# Patient Record
Sex: Female | Born: 1960 | Race: White | Hispanic: No | Marital: Married | Smoking: Never smoker
Health system: Southern US, Community
[De-identification: ages and names within clinical notes are randomized; demographics above are authoritative.]

## PROBLEM LIST (undated history)

## (undated) DIAGNOSIS — F419 Anxiety disorder, unspecified: Secondary | ICD-10-CM

## (undated) DIAGNOSIS — T7840XA Allergy, unspecified, initial encounter: Secondary | ICD-10-CM

## (undated) DIAGNOSIS — F32A Depression, unspecified: Secondary | ICD-10-CM

## (undated) DIAGNOSIS — I10 Essential (primary) hypertension: Secondary | ICD-10-CM

## (undated) DIAGNOSIS — R5382 Chronic fatigue, unspecified: Secondary | ICD-10-CM

## (undated) DIAGNOSIS — G43909 Migraine, unspecified, not intractable, without status migrainosus: Secondary | ICD-10-CM

## (undated) DIAGNOSIS — F329 Major depressive disorder, single episode, unspecified: Secondary | ICD-10-CM

## (undated) DIAGNOSIS — D4989 Neoplasm of unspecified behavior of other specified sites: Secondary | ICD-10-CM

## (undated) DIAGNOSIS — E785 Hyperlipidemia, unspecified: Secondary | ICD-10-CM

## (undated) DIAGNOSIS — K8021 Calculus of gallbladder without cholecystitis with obstruction: Secondary | ICD-10-CM

## (undated) DIAGNOSIS — G8929 Other chronic pain: Secondary | ICD-10-CM

## (undated) HISTORY — DX: Anxiety disorder, unspecified: F41.9

## (undated) HISTORY — PX: TONSILLECTOMY: SUR1361

## (undated) HISTORY — DX: Migraine, unspecified, not intractable, without status migrainosus: G43.909

## (undated) HISTORY — DX: Other chronic pain: G89.29

## (undated) HISTORY — PX: PAROTIDECTOMY: SUR1003

## (undated) HISTORY — DX: Chronic fatigue, unspecified: R53.82

## (undated) HISTORY — DX: Neoplasm of unspecified behavior of other specified sites: D49.89

## (undated) HISTORY — PX: CHOLECYSTECTOMY: SHX55

## (undated) HISTORY — DX: Allergy, unspecified, initial encounter: T78.40XA

## (undated) HISTORY — DX: Calculus of gallbladder without cholecystitis with obstruction: K80.21

---
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
## 1985-08-02 DIAGNOSIS — K8021 Calculus of gallbladder without cholecystitis with obstruction: Secondary | ICD-10-CM

## 1985-08-02 HISTORY — DX: Calculus of gallbladder without cholecystitis with obstruction: K80.21

## 2015-05-06 DIAGNOSIS — F1190 Opioid use, unspecified, uncomplicated: Secondary | ICD-10-CM | POA: Insufficient documentation

## 2015-05-06 DIAGNOSIS — E669 Obesity, unspecified: Secondary | ICD-10-CM | POA: Insufficient documentation

## 2015-05-06 DIAGNOSIS — G44221 Chronic tension-type headache, intractable: Secondary | ICD-10-CM | POA: Insufficient documentation

## 2015-05-06 DIAGNOSIS — F339 Major depressive disorder, recurrent, unspecified: Secondary | ICD-10-CM | POA: Insufficient documentation

## 2015-05-06 DIAGNOSIS — I10 Essential (primary) hypertension: Secondary | ICD-10-CM | POA: Insufficient documentation

## 2015-05-06 DIAGNOSIS — E785 Hyperlipidemia, unspecified: Secondary | ICD-10-CM | POA: Insufficient documentation

## 2016-01-21 DIAGNOSIS — G894 Chronic pain syndrome: Secondary | ICD-10-CM | POA: Insufficient documentation

## 2016-01-21 DIAGNOSIS — F331 Major depressive disorder, recurrent, moderate: Secondary | ICD-10-CM | POA: Insufficient documentation

## 2016-04-29 ENCOUNTER — Other Ambulatory Visit: Payer: Self-pay | Admitting: Family Medicine

## 2016-04-29 DIAGNOSIS — R519 Headache, unspecified: Secondary | ICD-10-CM

## 2016-04-29 DIAGNOSIS — R51 Headache: Principal | ICD-10-CM

## 2016-05-13 ENCOUNTER — Ambulatory Visit (HOSPITAL_BASED_OUTPATIENT_CLINIC_OR_DEPARTMENT_OTHER): Payer: Commercial Managed Care - HMO

## 2016-07-24 ENCOUNTER — Encounter (HOSPITAL_COMMUNITY): Payer: Self-pay

## 2016-07-24 ENCOUNTER — Emergency Department (HOSPITAL_COMMUNITY)
Admission: EM | Admit: 2016-07-24 | Discharge: 2016-07-24 | Disposition: A | Discharge status: Home / Self Care | Payer: Managed Care, Other (non HMO) | Attending: Emergency Medicine | Admitting: Emergency Medicine

## 2016-07-24 DIAGNOSIS — Y9389 Activity, other specified: Secondary | ICD-10-CM | POA: Diagnosis not present

## 2016-07-24 DIAGNOSIS — Y92512 Supermarket, store or market as the place of occurrence of the external cause: Secondary | ICD-10-CM | POA: Insufficient documentation

## 2016-07-24 DIAGNOSIS — Y999 Unspecified external cause status: Secondary | ICD-10-CM | POA: Insufficient documentation

## 2016-07-24 DIAGNOSIS — I10 Essential (primary) hypertension: Secondary | ICD-10-CM | POA: Diagnosis not present

## 2016-07-24 DIAGNOSIS — S0181XA Laceration without foreign body of other part of head, initial encounter: Secondary | ICD-10-CM

## 2016-07-24 DIAGNOSIS — S01112A Laceration without foreign body of left eyelid and periocular area, initial encounter: Secondary | ICD-10-CM | POA: Diagnosis not present

## 2016-07-24 DIAGNOSIS — R55 Syncope and collapse: Secondary | ICD-10-CM | POA: Insufficient documentation

## 2016-07-24 DIAGNOSIS — W228XXA Striking against or struck by other objects, initial encounter: Secondary | ICD-10-CM | POA: Diagnosis not present

## 2016-07-24 DIAGNOSIS — R600 Localized edema: Secondary | ICD-10-CM | POA: Insufficient documentation

## 2016-07-24 DIAGNOSIS — S0993XA Unspecified injury of face, initial encounter: Secondary | ICD-10-CM | POA: Diagnosis present

## 2016-07-24 HISTORY — DX: Depression, unspecified: F32.A

## 2016-07-24 HISTORY — DX: Hyperlipidemia, unspecified: E78.5

## 2016-07-24 HISTORY — DX: Essential (primary) hypertension: I10

## 2016-07-24 HISTORY — DX: Major depressive disorder, single episode, unspecified: F32.9

## 2016-07-24 LAB — BASIC METABOLIC PANEL
Anion gap: 10 (ref 5–15)
BUN: 20 mg/dL (ref 6–20)
CALCIUM: 9.3 mg/dL (ref 8.9–10.3)
CO2: 23 mmol/L (ref 22–32)
CREATININE: 1.07 mg/dL — AB (ref 0.44–1.00)
Chloride: 103 mmol/L (ref 101–111)
GFR calc Af Amer: >60 mL/min (ref >60)
GFR calc non Af Amer: 58 mL/min — ABNORMAL LOW (ref >60)
GLUCOSE: 144 mg/dL — AB (ref 65–99)
Potassium: 4.9 mmol/L (ref 3.5–5.1)
Sodium: 136 mmol/L (ref 135–145)

## 2016-07-24 LAB — CBC
HEMATOCRIT: 37.9 % (ref 36.0–46.0)
Hemoglobin: 12.5 g/dL (ref 12.0–15.0)
MCH: 28.2 pg (ref 26.0–34.0)
MCHC: 33 g/dL (ref 30.0–36.0)
MCV: 85.6 fL (ref 78.0–100.0)
Platelets: 372 10*3/uL (ref 150–400)
RBC: 4.43 MIL/uL (ref 3.87–5.11)
RDW: 14.1 % (ref 11.5–15.5)
WBC: 14.6 10*3/uL — ABNORMAL HIGH (ref 4.0–10.5)

## 2016-07-24 LAB — TROPONIN I: Troponin I: <0.03 ng/mL (ref <0.03)

## 2016-07-24 MED ORDER — DIAZEPAM 5 MG PO TABS
5.0000 mg | ORAL_TABLET | Freq: Once | ORAL | Status: AC
  Administered 2016-07-24: 5 mg via ORAL
  Filled 2016-07-24: qty 1

## 2016-07-24 MED ORDER — KETOROLAC TROMETHAMINE 15 MG/ML IJ SOLN
15.0000 mg | Freq: Once | INTRAMUSCULAR | Status: AC
  Administered 2016-07-24: 15 mg via INTRAVENOUS
  Filled 2016-07-24: qty 1

## 2016-07-24 MED ORDER — SODIUM CHLORIDE 0.9 % IV BOLUS (SEPSIS)
1000.0000 mL | Freq: Once | INTRAVENOUS | Status: AC
  Administered 2016-07-24: 1000 mL via INTRAVENOUS

## 2016-07-24 MED ORDER — CYCLOBENZAPRINE HCL 5 MG PO TABS: 5.0000 mg | ORAL_TABLET | Freq: Three times a day (TID) | ORAL | 0 refills | Status: DC | PRN

## 2016-07-24 NOTE — ED Provider Notes (Signed)
[facility] DEPT Provider Note   CSN: [id number] Arrival date & time: 07/24/16  1637     History   Chief Complaint Chief Complaint  Patient presents with  . Loss of Consciousness    HPI Andrea Mcclure is a 55 y.o. female.  Patient with history of high blood pressure, depression, chronic pain on multiple medications presents after syncopal episode and head injury. Patient was shopping at the store and then start feeling hot and lightheaded lean forte onto the grocery cart and hit the left orbit region. Initially mild blurry vision that resolved. Patient had one episode of nausea and vomiting that has resolved. Patient is not on blood thinners no neurologic deficits. Patient has tenderness to palpation. Patient has been having for months mild right-sided rib pain this is identical to her previous.      Past Medical History:  Diagnosis Date  . Depression   . Hyperlipidemia   . Hypertension     There are no active problems to display for this patient.   History reviewed. No pertinent surgical history.  OB History    No data available       Home Medications    Prior to Admission medications   Medication Sig Start Date End Date Taking? Authorizing Provider  ALPRAZolam Duanne Moron) 0.5 MG tablet Take 0.5 mg by mouth 2 (two) times daily as needed for anxiety.   Yes Historical Provider, MD  Cholecalciferol (VITAMIN D) 2000 units CAPS Take 1 capsule by mouth once a week.   Yes Historical Provider, MD  FLUoxetine (PROZAC) 40 MG capsule Take 40 mg by mouth daily.   Yes Historical Provider, MD  losartan (COZAAR) 50 MG tablet Take 50 mg by mouth daily.   Yes Historical Provider, MD  meloxicam (MOBIC) 15 MG tablet Take 15 mg by mouth daily.   Yes Historical Provider, MD  oxyCODONE-acetaminophen (PERCOCET/ROXICET) 5-325 MG tablet Take 1-2 tablets by mouth every 4 (four) hours as needed for severe pain.   Yes Historical Provider, MD  pravastatin (PRAVACHOL) 40 MG tablet Take 40 mg  by mouth daily.   Yes Historical Provider, MD  cyclobenzaprine (FLEXERIL) 5 MG tablet Take 1 tablet (5 mg total) by mouth 3 (three) times daily as needed for muscle spasms. 07/24/16   Elnora Morrison, MD    Family History History reviewed. No pertinent family history.  Social History Social History  Substance Use Topics  . Smoking status: Never Smoker  . Smokeless tobacco: Never Used  . Alcohol use No     Allergies   Patient has no allergy information on record.   Review of Systems Review of Systems  Constitutional: Positive for appetite change. Negative for chills and fever.  HENT: Negative for ear pain and sore throat.   Eyes: Positive for visual disturbance. Negative for pain.  Respiratory: Negative for cough and shortness of breath.   Cardiovascular: Negative for chest pain and palpitations.  Gastrointestinal: Positive for vomiting. Negative for abdominal pain.  Genitourinary: Negative for dysuria and hematuria.  Musculoskeletal: Positive for arthralgias. Negative for back pain.  Skin: Positive for wound. Negative for color change and rash.  Neurological: Positive for syncope and light-headedness. Negative for seizures.  All other systems reviewed and are negative.    Physical Exam Updated Vital Signs BP 137/69   Pulse 70   Temp 98.4 F (36.9 C) (Oral)   Resp 17   Ht 5\' 8"  (1.727 m)   Wt 223 lb (101.2 kg)   LMP 07/24/2012   SpO2  98%   BMI 33.91 kg/m   Physical Exam  Constitutional: She appears well-developed and well-nourished. No distress.  HENT:  Head: Normocephalic.  Patient has mild paraspinal tenderness cervical, mild tenderness lateral periorbital on the left. Bleeding controlled 1 cm laceration left lateral eyelid.  Eyes: Conjunctivae are normal.  Neck: Neck supple.  Cardiovascular: Normal rate and regular rhythm.   No murmur heard. Pulmonary/Chest: Effort normal and breath sounds normal. No respiratory distress.  Abdominal: Soft. There is no  tenderness.  Musculoskeletal: She exhibits edema and tenderness.  Neurological: She is alert. No cranial nerve deficit.  5+ strength upper and lower extremities equal bilateral, sensation intact palpation.  Skin: Skin is warm and dry.  Psychiatric: She has a normal mood and affect.  Nursing note and vitals reviewed.    ED Treatments / Results  Labs (all labs ordered are listed, but only abnormal results are displayed) Labs Reviewed  CBC - Abnormal; Notable for the following:       Result Value   WBC 14.6 (*)    All other components within normal limits  BASIC METABOLIC PANEL - Abnormal; Notable for the following:    Glucose, Bld 144 (*)    Creatinine, Ser 1.07 (*)    GFR calc non Af Amer 58 (*)    All other components within normal limits  TROPONIN I    EKG  EKG Interpretation  Date/Time:  Saturday July 24 2016 19:54:21 EST Ventricular Rate:  75 PR Interval:    QRS Duration: 93 QT Interval:  427 QTC Calculation: 477 R Axis:   -30 Text Interpretation:  Normal sinus rhythm artifact Borderline T abnormalities, inferior leads Confirmed by Reather Converse MD, Lauro Manlove [id number]) on 07/24/2016 7:59:05 PM       Radiology No results found.  Procedures .Marland KitchenLaceration Repair Date/Time: 07/24/2016 8:51 PM Performed by: Elnora Morrison Authorized by: Elnora Morrison   Consent:    Consent obtained:  Verbal   Consent given by:  Patient   Risks discussed:  Pain and infection Anesthesia (see MAR for exact dosages):    Anesthesia method:  None Laceration details:    Location:  Face   Face location:  L upper eyelid   Extent:  Superficial   Length (cm):  1   Depth (mm):  3 Exploration:    Wound exploration: wound explored through full range of motion     Contaminated: no   Treatment:    Area cleansed with:  Hibiclens   Amount of cleaning:  Standard Skin repair:    Repair method:  Tissue adhesive Approximation:    Approximation:  Close Post-procedure details:    Patient  tolerance of procedure:  Tolerated well, no immediate complications   (including critical care time)  Medications Ordered in ED Medications  sodium chloride 0.9 % bolus 1,000 mL (0 mLs Intravenous Stopped 07/24/16 1911)  diazepam (VALIUM) tablet 5 mg (5 mg Oral Given 07/24/16 1751)  ketorolac (TORADOL) 15 MG/ML injection 15 mg (15 mg Intravenous Given 07/24/16 2017)     Initial Impression / Assessment and Plan / ED Course  I have reviewed the triage vital signs and the nursing notes.  Pertinent labs & imaging results that were available during my care of the patient were reviewed by me and considered in my medical decision making (see chart for details).  Clinical Course    Patient presents after lightheaded/syncopal episode. Patient had baseline normal neurologic exam normal cardiac exam. Discussed CT scan of the chest to evaluate aorta with right-sided  chest pain however patients is identical to previous for months and refuses any imaging at this time. She understands she can change her mind or return at any time. Patient's cardiac and blood work screen unremarkable. Laceration repaired with Dermabond. Discussed outpatient follow-up for nonemergent echo. Patient feels this episode was secondary to her recent chronic pain medicine changes.    Results and differential diagnosis were discussed with the patient/parent/guardian. Xrays were independently reviewed by myself.  Close follow up outpatient was discussed, comfortable with the plan.   Medications  sodium chloride 0.9 % bolus 1,000 mL (0 mLs Intravenous Stopped 07/24/16 1911)  diazepam (VALIUM) tablet 5 mg (5 mg Oral Given 07/24/16 1751)  ketorolac (TORADOL) 15 MG/ML injection 15 mg (15 mg Intravenous Given 07/24/16 2017)    Vitals:   07/24/16 1909 07/24/16 1910 07/24/16 1930 07/24/16 2000  BP: 132/79  131/80 137/69  Pulse:  70 70 70  Resp:  16 18 17   Temp:      TempSrc:      SpO2:  99% 98% 98%  Weight:      Height:          Final diagnoses:  Syncope and collapse  Facial laceration, initial encounter     l Clinical Impressions(s) / ED Diagnoses   Final diagnoses:  Syncope and collapse  Facial laceration, initial encounter    New Prescriptions New Prescriptions   CYCLOBENZAPRINE (FLEXERIL) 5 MG TABLET    Take 1 tablet (5 mg total) by mouth 3 (three) times daily as needed for muscle spasms.     Elnora Morrison, MD 07/24/16 2052

## 2016-07-24 NOTE — Discharge Instructions (Signed)
If you were given medicines take as directed.  If you are on coumadin or contraceptives realize their levels and effectiveness is altered by many different medicines.  If you have any reaction (rash, tongues swelling, other) to the medicines stop taking and see a physician.    If your blood pressure was elevated in the ER make sure you follow up for management with a primary doctor or return for chest pain, shortness of breath or stroke symptoms.  Please follow up as directed and return to the ER or see a physician for new or worsening symptoms.  Thank you. Vitals:   07/24/16 1800 07/24/16 1909 07/24/16 1910 07/24/16 1930  BP: 118/63 132/79  131/80  Pulse: 71  70 70  Resp: 20  16 18   Temp:      TempSrc:      SpO2: 98%  99% 98%  Weight:      Height:

## 2016-07-24 NOTE — ED Notes (Signed)
Patient transported to X-ray 

## 2016-07-24 NOTE — ED Triage Notes (Signed)
Pt brought in by [facility]. Pt was shopping at the store and pt had began to feel hot and light headed. Pt husband reports that that patient had syncopal episode with +LOC. Pt hit head on floor and has small clean Lac on left eyelid. Pt reports having back and head pain. Pt also had one episode of emesis prior to ambulance arrival.

## 2016-10-07 ENCOUNTER — Other Ambulatory Visit: Payer: Self-pay | Admitting: Family Medicine

## 2016-10-07 DIAGNOSIS — Z1231 Encounter for screening mammogram for malignant neoplasm of breast: Secondary | ICD-10-CM

## 2016-11-23 ENCOUNTER — Ambulatory Visit
Admission: RE | Admit: 2016-11-23 | Discharge: 2016-11-23 | Disposition: A | Discharge status: Home / Self Care | Payer: BLUE CROSS/BLUE SHIELD | Source: Ambulatory Visit | Attending: Family Medicine | Admitting: Family Medicine

## 2016-11-23 DIAGNOSIS — Z1231 Encounter for screening mammogram for malignant neoplasm of breast: Secondary | ICD-10-CM

## 2017-10-24 ENCOUNTER — Encounter: Payer: Self-pay | Admitting: Nurse Practitioner

## 2017-10-24 ENCOUNTER — Other Ambulatory Visit: Payer: Self-pay

## 2017-10-24 ENCOUNTER — Ambulatory Visit
Admission: RE | Admit: 2017-10-24 | Discharge: 2017-10-24 | Disposition: A | Discharge status: Home / Self Care | Payer: Managed Care, Other (non HMO) | Source: Ambulatory Visit | Attending: Nurse Practitioner | Admitting: Nurse Practitioner

## 2017-10-24 ENCOUNTER — Ambulatory Visit: Payer: Managed Care, Other (non HMO) | Attending: Nurse Practitioner | Admitting: Nurse Practitioner

## 2017-10-24 VITALS — BP 155/85 | HR 105 | Temp 98.6°F | Ht 68.0 in | Wt 224.0 lb

## 2017-10-24 DIAGNOSIS — M5030 Other cervical disc degeneration, unspecified cervical region: Secondary | ICD-10-CM | POA: Diagnosis not present

## 2017-10-24 DIAGNOSIS — Z79891 Long term (current) use of opiate analgesic: Secondary | ICD-10-CM | POA: Diagnosis not present

## 2017-10-24 DIAGNOSIS — M545 Low back pain, unspecified: Secondary | ICD-10-CM | POA: Insufficient documentation

## 2017-10-24 DIAGNOSIS — G44229 Chronic tension-type headache, not intractable: Secondary | ICD-10-CM | POA: Insufficient documentation

## 2017-10-24 DIAGNOSIS — M542 Cervicalgia: Principal | ICD-10-CM

## 2017-10-24 DIAGNOSIS — G8929 Other chronic pain: Secondary | ICD-10-CM

## 2017-10-24 DIAGNOSIS — R51 Headache: Secondary | ICD-10-CM | POA: Insufficient documentation

## 2017-10-24 DIAGNOSIS — Z79899 Other long term (current) drug therapy: Secondary | ICD-10-CM

## 2017-10-24 DIAGNOSIS — E785 Hyperlipidemia, unspecified: Secondary | ICD-10-CM | POA: Diagnosis not present

## 2017-10-24 DIAGNOSIS — M546 Pain in thoracic spine: Secondary | ICD-10-CM

## 2017-10-24 DIAGNOSIS — F339 Major depressive disorder, recurrent, unspecified: Secondary | ICD-10-CM | POA: Diagnosis not present

## 2017-10-24 DIAGNOSIS — I10 Essential (primary) hypertension: Secondary | ICD-10-CM | POA: Insufficient documentation

## 2017-10-24 DIAGNOSIS — Z789 Other specified health status: Secondary | ICD-10-CM | POA: Diagnosis not present

## 2017-10-24 DIAGNOSIS — M899 Disorder of bone, unspecified: Secondary | ICD-10-CM

## 2017-10-24 DIAGNOSIS — G4486 Cervicogenic headache: Secondary | ICD-10-CM

## 2017-10-24 DIAGNOSIS — G894 Chronic pain syndrome: Secondary | ICD-10-CM

## 2017-10-24 NOTE — Patient Instructions (Addendum)
_______________________________________________________________________________You understand that you will complete med/psych evaluation and X-rays as ordered prior to next appointment with pain clinic._______________  Appointment Policy Summary  It is our goal and responsibility to provide the medical community with assistance in the evaluation and management of patients with chronic pain. Unfortunately our resources are limited. Because we do not have an unlimited amount of time, or available appointments, we are required to closely monitor and manage their use. The following rules exist to maximize their use:  Patient's responsibilities: 1. Punctuality:  At what time should I arrive? You should be physically present in our office 30 minutes before your scheduled appointment. Your scheduled appointment is with your assigned healthcare provider. However, it takes 5-10 minutes to be "checked-in", and another 15 minutes for the nurses to do the admission. If you arrive to our office at the time you were given for your appointment, you will end up being at least 20-25 minutes late to your appointment with the provider. 2. Tardiness:  What happens if I arrive only a few minutes after my scheduled appointment time? You will need to reschedule your appointment. The cutoff is your appointment time. This is why it is so important that you arrive at least 30 minutes before that appointment. If you have an appointment scheduled for 10:00 AM and you arrive at 10:01, you will be required to reschedule your appointment.  3. Plan ahead:  Always assume that you will encounter traffic on your way in. Plan for it. If you are dependent on a driver, make sure they understand these rules and the need to arrive early. 4. Other appointments and responsibilities:  Avoid scheduling any other appointments before or after your pain clinic appointments.  5. Be prepared:  Write down everything that you need to discuss with  your healthcare provider and give this information to the admitting nurse. Write down the medications that you will need refilled. Bring your pills and bottles (even the empty ones), to all of your appointments, except for those where a procedure is scheduled. 6. No children or pets:  Find someone to take care of them. It is not appropriate to bring them in. 7. Scheduling changes:  We request "advanced notification" of any changes or cancellations. 8. Advanced notification:  Defined as a time period of more than 24 hours prior to the originally scheduled appointment. This allows for the appointment to be offered to other patients. 9. Rescheduling:  When a visit is rescheduled, it will require the cancellation of the original appointment. For this reason they both fall within the category of "Cancellations".  10. Cancellations:  They require advanced notification. Any cancellation less than 24 hours before the  appointment will be recorded as a "No Show". 11. No Show:  Defined as an unkept appointment where the patient failed to notify or declare to the practice their intention or inability to keep the appointment.  Corrective process for repeat offenders:  1. Tardiness: Three (3) episodes of rescheduling due to late arrivals will be recorded as one (1) "No Show". 2. Cancellation or reschedule: Three (3) cancellations or rescheduling will be recorded as one (1) "No Show". 3. "No Shows": Three (3) "No Shows" within a 12 month period will result in discharge from the practice. ____________________________________________________________________________________________  __________________________________________________________________________________________  Pain Scale  Introduction: The pain score used by this practice is the Verbal Numerical Rating Scale (VNRS-11). This is an 11-point scale. It is for adults and children 10 years or older. There are significant differences in  how the pain  score is reported, used, and applied. Forget everything you learned in the past and learn this scoring system.  General Information: The scale should reflect your current level of pain. Unless you are specifically asked for the level of your worst pain, or your average pain. If you are asked for one of these two, then it should be understood that it is over the past 24 hours.  Basic Activities of Daily Living (ADL): Personal hygiene, dressing, eating, transferring, and using restroom.  Instructions: Most patients tend to report their level of pain as a combination of two factors, their physical pain and their psychosocial pain. This last one is also known as "suffering" and it is reflection of how physical pain affects you socially and psychologically. From now on, report them separately. From this point on, when asked to report your pain level, report only your physical pain. Use the following table for reference.  Pain Clinic Pain Levels (0-5/10)  Pain Level Score  Description  No Pain 0   Mild pain 1 Nagging, annoying, but does not interfere with basic activities of daily living (ADL). Patients are able to eat, bathe, get dressed, toileting (being able to get on and off the toilet and perform personal hygiene functions), transfer (move in and out of bed or a chair without assistance), and maintain continence (able to control bladder and bowel functions). Blood pressure and heart rate are unaffected. A normal heart rate for a healthy adult ranges from 60 to 100 bpm (beats per minute).   Mild to moderate pain 2 Noticeable and distracting. Impossible to hide from other people. More frequent flare-ups. Still possible to adapt and function close to normal. It can be very annoying and may have occasional stronger flare-ups. With discipline, patients may get used to it and adapt.   Moderate pain 3 Interferes significantly with activities of daily living (ADL). It becomes difficult to feed, bathe, get  dressed, get on and off the toilet or to perform personal hygiene functions. Difficult to get in and out of bed or a chair without assistance. Very distracting. With effort, it can be ignored when deeply involved in activities.   Moderately severe pain 4 Impossible to ignore for more than a few minutes. With effort, patients may still be able to manage work or participate in some social activities. Very difficult to concentrate. Signs of autonomic nervous system discharge are evident: dilated pupils (mydriasis); mild sweating (diaphoresis); sleep interference. Heart rate becomes elevated (>115 bpm). Diastolic blood pressure (lower number) rises above 100 mmHg. Patients find relief in laying down and not moving.   Severe pain 5 Intense and extremely unpleasant. Associated with frowning face and frequent crying. Pain overwhelms the senses.  Ability to do any activity or maintain social relationships becomes significantly limited. Conversation becomes difficult. Pacing back and forth is common, as getting into a comfortable position is nearly impossible. Pain wakes you up from deep sleep. Physical signs will be obvious: pupillary dilation; increased sweating; goosebumps; brisk reflexes; cold, clammy hands and feet; nausea, vomiting or dry heaves; loss of appetite; significant sleep disturbance with inability to fall asleep or to remain asleep. When persistent, significant weight loss is observed due to the complete loss of appetite and sleep deprivation.  Blood pressure and heart rate becomes significantly elevated. Caution: If elevated blood pressure triggers a pounding headache associated with blurred vision, then the patient should immediately seek attention at an urgent or emergency care unit, as these may be signs  of an impending stroke.    Emergency Department Pain Levels (6-10/10)  Emergency Room Pain 6 Severely limiting. Requires emergency care and should not be seen or managed at an outpatient pain  management facility. Communication becomes difficult and requires great effort. Assistance to reach the emergency department may be required. Facial flushing and profuse sweating along with potentially dangerous increases in heart rate and blood pressure will be evident.   Distressing pain 7 Self-care is very difficult. Assistance is required to transport, or use restroom. Assistance to reach the emergency department will be required. Tasks requiring coordination, such as bathing and getting dressed become very difficult.   Disabling pain 8 Self-care is no longer possible. At this level, pain is disabling. The individual is unable to do even the most "basic" activities such as walking, eating, bathing, dressing, transferring to a bed, or toileting. Fine motor skills are lost. It is difficult to think clearly.   Incapacitating pain 9 Pain becomes incapacitating. Thought processing is no longer possible. Difficult to remember your own name. Control of movement and coordination are lost.   The worst pain imaginable 10 At this level, most patients pass out from pain. When this level is reached, collapse of the autonomic nervous system occurs, leading to a sudden drop in blood pressure and heart rate. This in turn results in a temporary and dramatic drop in blood flow to the brain, leading to a loss of consciousness. Fainting is one of the body's self defense mechanisms. Passing out puts the brain in a calmed state and causes it to shut down for a while, in order to begin the healing process.    Summary: 1. Refer to this scale when providing [place] with your pain level. 2. Be accurate and careful when reporting your pain level. This will help with your care. 3. Over-reporting your pain level will lead to loss of credibility. 4. Even a level of 1/10 means that there is pain and will be treated at our facility. 5. High, inaccurate reporting will be documented as "Symptom Exaggeration", leading to loss of  credibility and suspicions of possible secondary gains such as obtaining more narcotics, or wanting to appear disabled, for fraudulent reasons. 6. Only pain levels of 5 or below will be seen at our facility. 7. Pain levels of 6 and above will be sent to the Emergency Department and the appointment cancelled. ____________________________________________________________________________________________

## 2017-10-24 NOTE — Progress Notes (Signed)
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   _______________________________________________________________________________You understand that you will complete med/psych evaluation and X-rays as ordered prior to next appointment with pain clinic._______________  Appointment Policy Summary  It is our goal and responsibility to provide the medical community with assistance in the evaluation and management of patients with chronic pain. Unfortunately our resources are limited. Because we do not have an unlimited amount of time, or available appointments, we are required to closely monitor and manage their use. The following rules exist to maximize their use:  Patient's responsibilities: 1. Punctuality:  At what time should I arrive? You should be physically present in our office 30 minutes before your scheduled appointment. Your scheduled appointment is with your assigned healthcare provider. However, it takes 5-10 minutes to be "checked-in", and another 15 minutes for the nurses to do the admission. If you arrive to our office at the time you were given for your appointment, you will end up being at least 20-25 minutes late to your appointment with the provider. 2. Tardiness:  What happens if I arrive only a few  minutes after my scheduled appointment time? You will need to reschedule your appointment. The cutoff is your appointment time. This is why it is so important that you arrive at least 30 minutes before that appointment. If you have an appointment scheduled for 10:00 AM and you arrive at 10:01, you will be required to reschedule your appointment.  3. Plan ahead:  Always assume that you will encounter traffic on your way in. Plan for it. If you are dependent on a driver, make sure they understand these rules and the need to arrive early. 4. Other appointments and responsibilities:  Avoid scheduling any other appointments before or after your pain clinic appointments.  5. Be prepared:  Write down everything that you need to discuss with your healthcare provider  and give this information to the admitting nurse. Write down the medications that you will need refilled. Bring your pills and bottles (even the empty ones), to all of your appointments, except for those where a procedure is scheduled. 6. No children or pets:  Find someone to take care of them. It is not appropriate to bring them in. 7. Scheduling changes:  We request "advanced notification" of any changes or cancellations. 8. Advanced notification:  Defined as a time period of more than 24 hours prior to the originally scheduled appointment. This allows for the appointment to be offered to other patients. 9. Rescheduling:  When a visit is rescheduled, it will require the cancellation of the original appointment. For this reason they both fall within the category of "Cancellations".  10. Cancellations:  They require advanced notification. Any cancellation less than 24 hours before the  appointment will be recorded as a "No Show". 11. No Show:  Defined as an unkept appointment where the patient failed to notify or declare to the practice their intention or inability to keep the appointment.  Corrective process for repeat offenders:   1. Tardiness: Three (3) episodes of rescheduling due to late arrivals will be recorded as one (1) "No Show". 2. Cancellation or reschedule: Three (3) cancellations or rescheduling will be recorded as one (1) "No Show". 3. "No Shows": Three (3) "No Shows" within a 12 month period will result in discharge from the practice. ____________________________________________________________________________________________  __________________________________________________________________________________________  Pain Scale  Introduction: The pain score used by this practice is the Verbal Numerical Rating Scale (VNRS-11). This is an 11-point scale. It is for adults and children 10 years or older. There are significant differences in how the pain score is reported, used, and applied. Forget everything you learned in the past and learn this scoring system.  General Information: The scale should reflect your current level of pain. Unless you are specifically asked for the level of your worst pain, or your average pain. If you are asked for one of these two, then it should be understood that it is over the past 24 hours.  Basic Activities of Daily Living (ADL): Personal hygiene, dressing, eating, transferring, and using restroom.  Instructions: Most patients tend to report their level of pain as a combination of two factors, their physical pain and their psychosocial pain. This last one is also known as "suffering" and it is reflection of how physical pain affects you socially and psychologically. From now on, report them separately. From this point on, when asked to report your pain level, report only your physical pain. Use the following table for reference.  Pain Clinic Pain Levels (0-5/10)  Pain Level Score  Description  No Pain 0   Mild pain 1 Nagging, annoying, but does not interfere with basic activities of daily living (ADL). Patients are able to eat, bathe, get dressed, toileting (being able to get  on and off the toilet and perform personal hygiene functions), transfer (move in and out of bed or a chair without assistance), and maintain continence (able to control bladder and bowel functions). Blood pressure and heart rate are unaffected. A normal heart rate for a healthy adult ranges from 60 to 100 bpm (beats per minute).   Mild to moderate pain 2 Noticeable and distracting. Impossible to hide from other people. More frequent flare-ups. Still possible to adapt and function close to normal. It can be very annoying and may have occasional stronger flare-ups. With discipline, patients may get used to it  and adapt.   Moderate pain 3 Interferes significantly with activities of daily living (ADL). It becomes difficult to feed, bathe, get dressed, get on and off the toilet or to perform personal hygiene functions. Difficult to get in and out of bed or a chair without assistance. Very distracting. With effort, it can be ignored when deeply involved in activities.   Moderately severe pain 4 Impossible to ignore for more than a few minutes. With effort, patients may still be able to manage work or participate in some social activities. Very difficult to concentrate. Signs of autonomic nervous system discharge are evident: dilated pupils (mydriasis); mild sweating (diaphoresis); sleep interference. Heart rate becomes elevated (>115 bpm). Diastolic blood pressure (lower number) rises above 100 mmHg. Patients find relief in laying down and not moving.   Severe pain 5 Intense and extremely unpleasant. Associated with frowning face and frequent crying. Pain overwhelms the senses.  Ability to do any activity or maintain social relationships becomes significantly limited. Conversation becomes difficult. Pacing back and forth is common, as getting into a comfortable position is nearly impossible. Pain wakes you up from deep sleep. Physical signs will be obvious: pupillary dilation; increased sweating; goosebumps;  brisk reflexes; cold, clammy hands and feet; nausea, vomiting or dry heaves; loss of appetite; significant sleep disturbance with inability to fall asleep or to remain asleep. When persistent, significant weight loss is observed due to the complete loss of appetite and sleep deprivation.  Blood pressure and heart rate becomes significantly elevated. Caution: If elevated blood pressure triggers a pounding headache associated with blurred vision, then the patient should immediately seek attention at an urgent or emergency care unit, as these may be signs of an impending stroke.    Emergency Department Pain Levels (6-10/10)  Emergency Room Pain 6 Severely limiting. Requires emergency care and should not be seen or managed at an outpatient pain management facility. Communication becomes difficult and requires great effort. Assistance to reach the emergency department may be required. Facial flushing and profuse sweating along with potentially dangerous increases in heart rate and blood pressure will be evident.   Distressing pain 7 Self-care is very difficult. Assistance is required to transport, or use restroom. Assistance to reach the emergency department will be required. Tasks requiring coordination, such as bathing and getting dressed become very difficult.   Disabling pain 8 Self-care is no longer possible. At this level, pain is disabling. The individual is unable to do even the most "basic" activities such as walking, eating, bathing, dressing, transferring to a bed, or toileting. Fine motor skills are lost. It is difficult to think clearly.   Incapacitating pain 9 Pain becomes incapacitating. Thought processing is no longer possible. Difficult to remember your own name. Control of movement and coordination are lost.   The worst pain imaginable 10 At this level, most patients pass out from pain. When this level is reached, collapse of the autonomic nervous system occurs, leading to a sudden drop in  blood pressure and heart rate. This in turn results in a temporary and dramatic drop in blood flow to the brain, leading to a loss of consciousness. Fainting is one of the body's self defense mechanisms. Passing out puts the brain in a calmed state and causes it to shut down for a while, in order to begin the healing process.    Summary: 1. Refer to this scale when providing [place] with your pain level. 2. Be accurate and careful when reporting your pain level. This will help  with your care. 3. Over-reporting your pain level will lead to loss of credibility. 4. Even a level of 1/10 means that there is pain and will be treated at our facility. 5. High, inaccurate reporting will be documented as "Symptom Exaggeration", leading to loss of credibility and suspicions of possible secondary gains such as obtaining more narcotics, or wanting to appear disabled, for fraudulent reasons. 6. Only pain levels of 5 or below will be seen at our facility. 7. Pain levels of 6 and above will be sent to the Emergency Department and the appointment cancelled. ____________________________________________________________________________________________

## 2017-10-25 NOTE — Progress Notes (Signed)
Results were reviewed and found to be: mildly abnormal  No acute injury or pathology identified  Review would suggest interventional pain management techniques may be of benefit 

## 2017-10-28 LAB — COMPLIANCE DRUG ANALYSIS, UR

## 2017-10-30 LAB — MAGNESIUM: MAGNESIUM: 2.5 mg/dL — AB (ref 1.6–2.3)

## 2017-10-30 LAB — VITAMIN B12: Vitamin B-12: 518 pg/mL (ref 232–1245)

## 2017-10-30 LAB — 25-HYDROXYVITAMIN D LCMS D2+D3
25-HYDROXY, VITAMIN D-2: 8 ng/mL
25-HYDROXY, VITAMIN D: 48 ng/mL

## 2017-10-30 LAB — SEDIMENTATION RATE: Sed Rate: 15 mm/hr (ref 0–40)

## 2017-10-30 LAB — C-REACTIVE PROTEIN: CRP: 2.3 mg/L (ref 0.0–4.9)

## 2017-10-30 LAB — 25-HYDROXY VITAMIN D LCMS D2+D3: 25-Hydroxy, Vitamin D-3: 40 ng/mL

## 2017-11-03 ENCOUNTER — Other Ambulatory Visit: Payer: Self-pay
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
## 2017-11-14 ENCOUNTER — Ambulatory Visit: Payer: Managed Care, Other (non HMO) | Attending: Pain Medicine | Admitting: Pain Medicine

## 2017-11-14 ENCOUNTER — Encounter: Payer: Self-pay | Admitting: Pain Medicine

## 2017-11-14 VITALS — BP 155/83 | HR 104 | Temp 98.6°F | Resp 16 | Ht 68.0 in | Wt 225.0 lb

## 2017-11-14 DIAGNOSIS — Z79891 Long term (current) use of opiate analgesic: Secondary | ICD-10-CM

## 2017-11-14 DIAGNOSIS — Z809 Family history of malignant neoplasm, unspecified: Secondary | ICD-10-CM | POA: Insufficient documentation

## 2017-11-14 DIAGNOSIS — M545 Low back pain, unspecified: Secondary | ICD-10-CM

## 2017-11-14 DIAGNOSIS — I10 Essential (primary) hypertension: Secondary | ICD-10-CM | POA: Diagnosis not present

## 2017-11-14 DIAGNOSIS — Z9889 Other specified postprocedural states: Secondary | ICD-10-CM | POA: Diagnosis not present

## 2017-11-14 DIAGNOSIS — M4310 Spondylolisthesis, site unspecified: Secondary | ICD-10-CM | POA: Diagnosis not present

## 2017-11-14 DIAGNOSIS — F419 Anxiety disorder, unspecified: Secondary | ICD-10-CM | POA: Diagnosis not present

## 2017-11-14 DIAGNOSIS — S22040S Wedge compression fracture of fourth thoracic vertebra, sequela: Secondary | ICD-10-CM

## 2017-11-14 DIAGNOSIS — Z79899 Other long term (current) drug therapy: Secondary | ICD-10-CM | POA: Diagnosis not present

## 2017-11-14 DIAGNOSIS — M546 Pain in thoracic spine: Secondary | ICD-10-CM

## 2017-11-14 DIAGNOSIS — F1290 Cannabis use, unspecified, uncomplicated: Secondary | ICD-10-CM | POA: Diagnosis not present

## 2017-11-14 DIAGNOSIS — E669 Obesity, unspecified: Secondary | ICD-10-CM | POA: Diagnosis not present

## 2017-11-14 DIAGNOSIS — M5030 Other cervical disc degeneration, unspecified cervical region: Secondary | ICD-10-CM | POA: Diagnosis not present

## 2017-11-14 DIAGNOSIS — R52 Pain, unspecified: Secondary | ICD-10-CM

## 2017-11-14 DIAGNOSIS — Z888 Allergy status to other drugs, medicaments and biological substances status: Secondary | ICD-10-CM | POA: Insufficient documentation

## 2017-11-14 DIAGNOSIS — M479 Spondylosis, unspecified: Secondary | ICD-10-CM | POA: Diagnosis not present

## 2017-11-14 DIAGNOSIS — M5136 Other intervertebral disc degeneration, lumbar region: Secondary | ICD-10-CM | POA: Insufficient documentation

## 2017-11-14 DIAGNOSIS — M9981 Other biomechanical lesions of cervical region: Secondary | ICD-10-CM

## 2017-11-14 DIAGNOSIS — M4316 Spondylolisthesis, lumbar region: Secondary | ICD-10-CM | POA: Insufficient documentation

## 2017-11-14 DIAGNOSIS — R51 Headache: Secondary | ICD-10-CM

## 2017-11-14 DIAGNOSIS — G44221 Chronic tension-type headache, intractable: Secondary | ICD-10-CM

## 2017-11-14 DIAGNOSIS — R892 Abnormal level of other drugs, medicaments and biological substances in specimens from other organs, systems and tissues: Secondary | ICD-10-CM

## 2017-11-14 DIAGNOSIS — G4486 Cervicogenic headache: Secondary | ICD-10-CM

## 2017-11-14 DIAGNOSIS — Z881 Allergy status to other antibiotic agents status: Secondary | ICD-10-CM | POA: Insufficient documentation

## 2017-11-14 DIAGNOSIS — M5010 Cervical disc disorder with radiculopathy, unspecified cervical region: Secondary | ICD-10-CM | POA: Diagnosis not present

## 2017-11-14 DIAGNOSIS — Z789 Other specified health status: Secondary | ICD-10-CM

## 2017-11-14 DIAGNOSIS — M4802 Spinal stenosis, cervical region: Secondary | ICD-10-CM

## 2017-11-14 DIAGNOSIS — R079 Chest pain, unspecified: Secondary | ICD-10-CM | POA: Diagnosis not present

## 2017-11-14 DIAGNOSIS — M899 Disorder of bone, unspecified: Secondary | ICD-10-CM

## 2017-11-14 DIAGNOSIS — M48061 Spinal stenosis, lumbar region without neurogenic claudication: Secondary | ICD-10-CM | POA: Diagnosis not present

## 2017-11-14 DIAGNOSIS — M47816 Spondylosis without myelopathy or radiculopathy, lumbar region: Secondary | ICD-10-CM | POA: Diagnosis not present

## 2017-11-14 DIAGNOSIS — G894 Chronic pain syndrome: Secondary | ICD-10-CM

## 2017-11-14 DIAGNOSIS — R531 Weakness: Secondary | ICD-10-CM | POA: Insufficient documentation

## 2017-11-14 DIAGNOSIS — F1990 Other psychoactive substance use, unspecified, uncomplicated: Secondary | ICD-10-CM

## 2017-11-14 DIAGNOSIS — Z8489 Family history of other specified conditions: Secondary | ICD-10-CM | POA: Diagnosis not present

## 2017-11-14 DIAGNOSIS — S22049S Unspecified fracture of fourth thoracic vertebra, sequela: Secondary | ICD-10-CM | POA: Diagnosis not present

## 2017-11-14 DIAGNOSIS — E66811 Obesity, class 1: Secondary | ICD-10-CM

## 2017-11-14 DIAGNOSIS — G8929 Other chronic pain: Secondary | ICD-10-CM

## 2017-11-14 DIAGNOSIS — M542 Cervicalgia: Secondary | ICD-10-CM

## 2017-11-14 DIAGNOSIS — R5382 Chronic fatigue, unspecified: Secondary | ICD-10-CM | POA: Diagnosis not present

## 2017-11-14 DIAGNOSIS — E785 Hyperlipidemia, unspecified: Secondary | ICD-10-CM | POA: Diagnosis not present

## 2017-11-14 DIAGNOSIS — M47812 Spondylosis without myelopathy or radiculopathy, cervical region: Secondary | ICD-10-CM

## 2017-11-14 DIAGNOSIS — M51369 Other intervertebral disc degeneration, lumbar region without mention of lumbar back pain or lower extremity pain: Secondary | ICD-10-CM

## 2017-11-14 DIAGNOSIS — R519 Headache, unspecified: Secondary | ICD-10-CM

## 2017-11-14 DIAGNOSIS — M5134 Other intervertebral disc degeneration, thoracic region: Secondary | ICD-10-CM

## 2017-11-14 DIAGNOSIS — M7918 Myalgia, other site: Secondary | ICD-10-CM

## 2017-11-14 DIAGNOSIS — M797 Fibromyalgia: Secondary | ICD-10-CM | POA: Diagnosis not present

## 2017-11-14 NOTE — Progress Notes (Signed)
Safety precautions to be maintained throughout the outpatient stay will include: orient to surroundings, keep bed in low position, maintain call bell within reach at all times, provide assistance with transfer out of bed and ambulation.   Patient had a reaction from levoflaxcin that was prescribed for sinus infection and caused increase in pain and very limited ROM.

## 2017-11-14 NOTE — Patient Instructions (Addendum)
____________________________________________________________________________________________  Preparing for Procedure with Sedation  Instructions: . Oral Intake: Do not eat or drink anything for at least 8 hours prior to your procedure. . Transportation: Public transportation is not allowed. Bring an adult driver. The driver must be physically present in our waiting room before any procedure can be started. Marland Kitchen Physical Assistance: Bring an adult physically capable of assisting you, in the event you need help. This adult should keep you company at home for at least 6 hours after the procedure. . Blood Pressure Medicine: Take your blood pressure medicine with a sip of water the morning of the procedure. . Blood thinners:  . Diabetics on insulin: Notify the staff so that you can be scheduled 1st case in the morning. If your diabetes requires high dose insulin, take only  of your normal insulin dose the morning of the procedure and notify the staff that you have done so. . Preventing infections: Shower with an antibacterial soap the morning of your procedure. . Build-up your immune system: Take 1000 mg of Vitamin C with every meal (3 times a day) the day prior to your procedure. Marland Kitchen Antibiotics: Inform the staff if you have a condition or reason that requires you to take antibiotics before dental procedures. . Pregnancy: If you are pregnant, call and cancel the procedure. . Sickness: If you have a cold, fever, or any active infections, call and cancel the procedure. . Arrival: You must be in the facility at least 30 minutes prior to your scheduled procedure. . Children: Do not bring children with you. . Dress appropriately: Bring dark clothing that you would not mind if they get stained. . Valuables: Do not bring any jewelry or valuables.  Procedure appointments are reserved for interventional treatments only. Marland Kitchen No Prescription Refills. . No medication changes will be discussed during procedure  appointments. . No disability issues will be discussed.  Remember:  Regular Business hours are:  Monday to Thursday 8:00 AM to 4:00 PM  Provider's Schedule: Milinda Pointer, MD:  Procedure days: Tuesday and Thursday 7:30 AM to 4:00 PM  Gillis Santa, MD:  Procedure days: Monday and Wednesday 7:30 AM to 4:00 PM ____________________________________________________________________________________________   ____________________________________________________________________________________________  General Risks and Possible Complications  Patient Responsibilities: It is important that you read this as it is part of your informed consent. It is our duty to inform you of the risks and possible complications associated with treatments offered to you. It is your responsibility as a patient to read this and to ask questions about anything that is not clear or that you believe was not covered in this document.  Patient's Rights: You have the right to refuse treatment. You also have the right to change your mind, even after initially having agreed to have the treatment done. However, under this last option, if you wait until the last second to change your mind, you may be charged for the materials used up to that point.  Introduction: Medicine is not an Chief Strategy Officer. Everything in Medicine, including the lack of treatment(s), carries the potential for danger, harm, or loss (which is by definition: Risk). In Medicine, a complication is a secondary problem, condition, or disease that can aggravate an already existing one. All treatments carry the risk of possible complications. The fact that a side effects or complications occurs, does not imply that the treatment was conducted incorrectly. It must be clearly understood that these can happen even when everything is done following the highest safety standards.  No treatment:  You can choose not to proceed with the proposed treatment alternative. The  "PRO(s)" would include: avoiding the risk of complications associated with the therapy. The "CON(s)" would include: not getting any of the treatment benefits. These benefits fall under one of three categories: diagnostic; therapeutic; and/or palliative. Diagnostic benefits include: getting information which can ultimately lead to improvement of the disease or symptom(s). Therapeutic benefits are those associated with the successful treatment of the disease. Finally, palliative benefits are those related to the decrease of the primary symptoms, without necessarily curing the condition (example: decreasing the pain from a flare-up of a chronic condition, such as incurable terminal cancer).  General Risks and Complications: These are associated to most interventional treatments. They can occur alone, or in combination. They fall under one of the following six (6) categories: no benefit or worsening of symptoms; bleeding; infection; nerve damage; allergic reactions; and/or death. 1. No benefits or worsening of symptoms: In Medicine there are no guarantees, only probabilities. No healthcare provider can ever guarantee that a medical treatment will work, they can only state the probability that it may. Furthermore, there is always the possibility that the condition may worsen, either directly, or indirectly, as a consequence of the treatment. 2. Bleeding: This is more common if the patient is taking a blood thinner, either prescription or over the counter (example: Goody Powders, Fish oil, Aspirin, Garlic, etc.), or if suffering a condition associated with impaired coagulation (example: Hemophilia, cirrhosis of the liver, low platelet counts, etc.). However, even if you do not have one on these, it can still happen. If you have any of these conditions, or take one of these drugs, make sure to notify your treating physician. 3. Infection: This is more common in patients with a compromised immune system, either due to  disease (example: diabetes, cancer, human immunodeficiency virus [HIV], etc.), or due to medications or treatments (example: therapies used to treat cancer and rheumatological diseases). However, even if you do not have one on these, it can still happen. If you have any of these conditions, or take one of these drugs, make sure to notify your treating physician. 4. Nerve Damage: This is more common when the treatment is an invasive one, but it can also happen with the use of medications, such as those used in the treatment of cancer. The damage can occur to small secondary nerves, or to large primary ones, such as those in the spinal cord and brain. This damage may be temporary or permanent and it may lead to impairments that can range from temporary numbness to permanent paralysis and/or brain death. 5. Allergic Reactions: Any time a substance or material comes in contact with our body, there is the possibility of an allergic reaction. These can range from a mild skin rash (contact dermatitis) to a severe systemic reaction (anaphylactic reaction), which can result in death. 6. Death: In general, any medical intervention can result in death, most of the time due to an unforeseen complication. ____________________________________________________________________________________________

## 2017-11-15 DIAGNOSIS — E669 Obesity, unspecified: Secondary | ICD-10-CM | POA: Insufficient documentation

## 2017-11-15 DIAGNOSIS — M546 Pain in thoracic spine: Secondary | ICD-10-CM | POA: Insufficient documentation

## 2017-11-15 DIAGNOSIS — M5030 Other cervical disc degeneration, unspecified cervical region: Secondary | ICD-10-CM | POA: Insufficient documentation

## 2017-11-15 DIAGNOSIS — F1990 Other psychoactive substance use, unspecified, uncomplicated: Secondary | ICD-10-CM | POA: Insufficient documentation

## 2017-11-15 DIAGNOSIS — G8929 Other chronic pain: Secondary | ICD-10-CM | POA: Insufficient documentation

## 2017-11-15 DIAGNOSIS — R52 Pain, unspecified: Secondary | ICD-10-CM

## 2017-11-15 DIAGNOSIS — M5136 Other intervertebral disc degeneration, lumbar region: Secondary | ICD-10-CM | POA: Insufficient documentation

## 2017-11-15 DIAGNOSIS — M4310 Spondylolisthesis, site unspecified: Secondary | ICD-10-CM | POA: Insufficient documentation

## 2017-11-15 DIAGNOSIS — M7918 Myalgia, other site: Secondary | ICD-10-CM

## 2017-11-15 DIAGNOSIS — M47816 Spondylosis without myelopathy or radiculopathy, lumbar region: Secondary | ICD-10-CM | POA: Insufficient documentation

## 2017-11-15 DIAGNOSIS — M51369 Other intervertebral disc degeneration, lumbar region without mention of lumbar back pain or lower extremity pain: Secondary | ICD-10-CM | POA: Insufficient documentation

## 2017-11-15 DIAGNOSIS — S22040S Wedge compression fracture of fourth thoracic vertebra, sequela: Secondary | ICD-10-CM | POA: Insufficient documentation

## 2017-11-15 DIAGNOSIS — R51 Headache: Secondary | ICD-10-CM

## 2017-11-15 DIAGNOSIS — M47812 Spondylosis without myelopathy or radiculopathy, cervical region: Secondary | ICD-10-CM | POA: Insufficient documentation

## 2017-11-15 DIAGNOSIS — M4802 Spinal stenosis, cervical region: Secondary | ICD-10-CM | POA: Insufficient documentation

## 2017-11-15 DIAGNOSIS — Z79899 Other long term (current) drug therapy: Secondary | ICD-10-CM | POA: Insufficient documentation

## 2017-11-15 DIAGNOSIS — M5134 Other intervertebral disc degeneration, thoracic region: Secondary | ICD-10-CM | POA: Insufficient documentation

## 2017-11-15 DIAGNOSIS — M797 Fibromyalgia: Secondary | ICD-10-CM | POA: Insufficient documentation

## 2017-11-15 DIAGNOSIS — R5382 Chronic fatigue, unspecified: Secondary | ICD-10-CM

## 2017-11-15 DIAGNOSIS — R519 Headache, unspecified: Secondary | ICD-10-CM | POA: Insufficient documentation

## 2017-11-15 DIAGNOSIS — F1290 Cannabis use, unspecified, uncomplicated: Secondary | ICD-10-CM | POA: Insufficient documentation

## 2017-11-15 DIAGNOSIS — R892 Abnormal level of other drugs, medicaments and biological substances in specimens from other organs, systems and tissues: Secondary | ICD-10-CM | POA: Insufficient documentation

## 2017-11-15 DIAGNOSIS — M542 Cervicalgia: Secondary | ICD-10-CM | POA: Insufficient documentation

## 2018-10-26 ENCOUNTER — Other Ambulatory Visit: Payer: Self-pay | Admitting: Family Medicine

## 2018-10-26 DIAGNOSIS — Z1231 Encounter for screening mammogram for malignant neoplasm of breast: Secondary | ICD-10-CM

## 2018-12-20 ENCOUNTER — Ambulatory Visit: Payer: Managed Care, Other (non HMO)

## 2019-02-01 ENCOUNTER — Ambulatory Visit: Payer: Managed Care, Other (non HMO)

## 2019-03-08 ENCOUNTER — Other Ambulatory Visit: Payer: Self-pay

## 2019-03-08 ENCOUNTER — Ambulatory Visit
Admission: RE | Admit: 2019-03-08 | Discharge: 2019-03-08 | Disposition: A | Discharge status: Home / Self Care | Payer: Managed Care, Other (non HMO) | Source: Ambulatory Visit | Attending: Family Medicine | Admitting: Family Medicine

## 2019-03-08 DIAGNOSIS — Z1231 Encounter for screening mammogram for malignant neoplasm of breast: Secondary | ICD-10-CM

## 2019-03-12 ENCOUNTER — Other Ambulatory Visit: Payer: Self-pay | Admitting: Family Medicine

## 2019-03-12 DIAGNOSIS — R928 Other abnormal and inconclusive findings on diagnostic imaging of breast: Secondary | ICD-10-CM

## 2019-03-14 ENCOUNTER — Other Ambulatory Visit: Payer: Self-pay | Admitting: Family Medicine

## 2019-03-14 ENCOUNTER — Ambulatory Visit
Admission: RE | Admit: 2019-03-14 | Discharge: 2019-03-14 | Disposition: A | Discharge status: Home / Self Care | Payer: Managed Care, Other (non HMO) | Source: Ambulatory Visit | Attending: Family Medicine | Admitting: Family Medicine

## 2019-03-14 ENCOUNTER — Other Ambulatory Visit: Payer: Self-pay

## 2019-03-14 ENCOUNTER — Other Ambulatory Visit: Payer: Managed Care, Other (non HMO)

## 2019-03-14 DIAGNOSIS — R928 Other abnormal and inconclusive findings on diagnostic imaging of breast: Secondary | ICD-10-CM

## 2019-03-14 DIAGNOSIS — N6320 Unspecified lump in the left breast, unspecified quadrant: Secondary | ICD-10-CM

## 2019-03-16 ENCOUNTER — Other Ambulatory Visit: Payer: Self-pay

## 2019-03-16 ENCOUNTER — Ambulatory Visit
Admission: RE | Admit: 2019-03-16 | Discharge: 2019-03-16 | Disposition: A | Discharge status: Home / Self Care | Payer: Managed Care, Other (non HMO) | Source: Ambulatory Visit | Attending: Family Medicine | Admitting: Family Medicine

## 2019-03-16 DIAGNOSIS — N6320 Unspecified lump in the left breast, unspecified quadrant: Secondary | ICD-10-CM

## 2019-05-18 ENCOUNTER — Other Ambulatory Visit: Payer: Self-pay | Admitting: Pain Medicine

## 2019-05-18 DIAGNOSIS — M542 Cervicalgia: Secondary | ICD-10-CM

## 2019-05-29 ENCOUNTER — Other Ambulatory Visit: Payer: Self-pay

## 2019-05-29 ENCOUNTER — Ambulatory Visit
Admission: RE | Admit: 2019-05-29 | Discharge: 2019-05-29 | Disposition: A | Discharge status: Home / Self Care | Payer: Managed Care, Other (non HMO) | Source: Ambulatory Visit | Attending: Pain Medicine | Admitting: Pain Medicine

## 2019-05-29 DIAGNOSIS — M542 Cervicalgia: Secondary | ICD-10-CM

## 2019-06-06 ENCOUNTER — Other Ambulatory Visit: Payer: Self-pay | Admitting: Otolaryngology

## 2019-06-06 DIAGNOSIS — E041 Nontoxic single thyroid nodule: Secondary | ICD-10-CM

## 2019-06-20 ENCOUNTER — Other Ambulatory Visit: Payer: Managed Care, Other (non HMO)

## 2019-06-25 ENCOUNTER — Ambulatory Visit
Admission: RE | Admit: 2019-06-25 | Discharge: 2019-06-25 | Disposition: A | Discharge status: Home / Self Care | Payer: Managed Care, Other (non HMO) | Source: Ambulatory Visit | Attending: Otolaryngology | Admitting: Otolaryngology

## 2019-06-25 DIAGNOSIS — E041 Nontoxic single thyroid nodule: Secondary | ICD-10-CM

## 2019-11-06 ENCOUNTER — Other Ambulatory Visit: Payer: Self-pay | Admitting: Otolaryngology

## 2019-11-06 DIAGNOSIS — E041 Nontoxic single thyroid nodule: Secondary | ICD-10-CM

## 2019-11-19 ENCOUNTER — Other Ambulatory Visit: Payer: Managed Care, Other (non HMO)

## 2019-11-20 ENCOUNTER — Ambulatory Visit
Admission: RE | Admit: 2019-11-20 | Discharge: 2019-11-20 | Disposition: A | Discharge status: Home / Self Care | Payer: Managed Care, Other (non HMO) | Source: Ambulatory Visit | Attending: Otolaryngology | Admitting: Otolaryngology

## 2019-11-20 DIAGNOSIS — E041 Nontoxic single thyroid nodule: Secondary | ICD-10-CM

## 2020-05-29 ENCOUNTER — Telehealth: Payer: Self-pay | Admitting: Hematology and Oncology

## 2020-05-29 NOTE — Telephone Encounter (Signed)
Received a new hem referral from Dr. Olen Pel for rising platelet count. Andrea Mcclure has been cld and scheduled to see Dr. Alvy Bimler on 11/2 at 1pm. Pt aware to arrive 30 minutes early.

## 2020-06-02 DIAGNOSIS — D75839 Thrombocytosis, unspecified: Secondary | ICD-10-CM | POA: Insufficient documentation

## 2020-06-02 DIAGNOSIS — E041 Nontoxic single thyroid nodule: Secondary | ICD-10-CM | POA: Insufficient documentation

## 2020-06-02 DIAGNOSIS — Z86018 Personal history of other benign neoplasm: Secondary | ICD-10-CM | POA: Insufficient documentation

## 2020-06-02 NOTE — Progress Notes (Signed)
[place] NOTE  Patient Care Team: Masneri, [name], DO as PCP - General (Family Medicine)  CHIEF COMPLAINTS/PURPOSE OF CONSULTATION:  Significant thrombocytosis She is here accompanied by her husband Darnell Level She is an Scientist, physiological.  She has 1 daughter  HISTORY OF PRESENTING ILLNESS:  Andrea Mcclure 59 y.o. female is here because of thrombocytosis  She was found to have abnormal CBC from blood work monitoring by her primary care doctor I have the opportunity to review skin records On 10/20/2017, her platelet count was 460,000 On March 09, 2018, platelet count was 469,000 On May 23, 2020, her platelet count was 606,000 with normal CBC and hemoglobin She is noted to have history of pleomorphic adenoma status post resection and a thyroid nodule being monitored by ENT service  She denies recent bruising/bleeding, such as spontaneous epistaxis, hematuria, melena or hematochezia. She has some minor intermittent gum bleeding The patient denies history of liver disease, exposure to heparin, history of cardiac murmur/prior cardiovascular surgery or recent new medications She denies prior blood or platelet transfusions The patient have significant diffuse musculoskeletal pain especially after her recent Tdap/influenza vaccination Her pain was so severe that she was instructed to stop her cholesterol medicine and was started on prednisone recently that seems to help  Since her parotid gland surgery, she did have some chronic dry mouth She have mild intermittent sinus drainage from allergies and occasional sore throat  MEDICAL HISTORY:  Past Medical History:  Diagnosis Date  . Allergy   . Anxiety   . Chronic fatigue   . Chronic pain   . Depression   . Depression   . Gallbladder calculus with obstruction 1987  . Hyperlipidemia   . Hypertension   . Migraine   . Tumor of jaw 1979    SURGICAL HISTORY: Past Surgical History:  Procedure Laterality  Date  . CHOLECYSTECTOMY    . PAROTIDECTOMY    . TONSILLECTOMY      SOCIAL HISTORY: Social History   Socioeconomic History  . Marital status: Married    Spouse name: Darnell Level  . Number of children: 1  . Years of education: Not on file  . Highest education level: Not on file  Occupational History  . Occupation: Scientist, physiological  Tobacco Use  . Smoking status: Never Smoker  . Smokeless tobacco: Never Used  Substance and Sexual Activity  . Alcohol use: No  . Drug use: No  . Sexual activity: Yes  Other Topics Concern  . Not on file  Social History Narrative  . Not on file   Social Determinants of Health   Financial Resource Strain:   . Difficulty of Paying Living Expenses: Not on file  Food Insecurity:   . Worried About Charity fundraiser in the Last Year: Not on file  . Ran Out of Food in the Last Year: Not on file  Transportation Needs:   . Lack of Transportation (Medical): Not on file  . Lack of Transportation (Non-Medical): Not on file  Physical Activity:   . Days of Exercise per Week: Not on file  . Minutes of Exercise per Session: Not on file  Stress:   . Feeling of Stress : Not on file  Social Connections:   . Frequency of Communication with Friends and Family: Not on file  . Frequency of Social Gatherings with Friends and Family: Not on file  . Attends Religious Services: Not on file  . Active Member of Clubs or Organizations: Not on file  .  Attends Archivist Meetings: Not on file  . Marital Status: Not on file  Intimate Partner Violence:   . Fear of Current or Ex-Partner: Not on file  . Emotionally Abused: Not on file  . Physically Abused: Not on file  . Sexually Abused: Not on file    FAMILY HISTORY: Family History  Problem Relation Age of Onset  . Cancer Mother   . Uterine cancer Mother   . Immunodeficiency Sister     ALLERGIES:  is allergic to gabapentin, levaquin [levofloxacin], metformin and related, tizanidine, venlafaxine, wellbutrin  [bupropion], and biofreeze [menthol (topical analgesic)].  MEDICATIONS:  Current Outpatient Medications  Medication Sig Dispense Refill  . predniSONE (DELTASONE) 20 MG tablet Take 20 mg by mouth daily.    Marland Kitchen aspirin 325 MG tablet Take 325 mg by mouth daily.    . Cholecalciferol (VITAMIN D) 2000 units CAPS Take 1 capsule by mouth once a week.    Marland Kitchen FLUoxetine (PROZAC) 40 MG capsule Take 40 mg by mouth daily.    Marland Kitchen losartan (COZAAR) 50 MG tablet Take 50 mg by mouth daily.    Marland Kitchen oxyCODONE-acetaminophen (PERCOCET/ROXICET) 5-325 MG tablet Take 1-2 tablets by mouth every 4 (four) hours as needed for severe pain.     No current facility-administered medications for this visit.    REVIEW OF SYSTEMS:   Constitutional: Denies fevers, chills or abnormal night sweats Eyes: Denies blurriness of vision, double vision or watery eyes Respiratory: Denies cough, dyspnea or wheezes Cardiovascular: Denies palpitation, chest discomfort or lower extremity swelling Gastrointestinal:  Denies nausea, heartburn or change in bowel habits Skin: Denies abnormal skin rashes Lymphatics: Denies new lymphadenopathy or easy bruising Neurological:Denies numbness, tingling or new weaknesses Behavioral/Psych: Mood is stable, no new changes  All other systems were reviewed with the patient and are negative.  PHYSICAL EXAMINATION: ECOG PERFORMANCE STATUS: 1 - Symptomatic but completely ambulatory  Vitals:   06/03/20 1256  BP: 138/80  Pulse: 89  Resp: 18  Temp: (!) 96.9 F (36.1 C)  SpO2: 98%   Filed Weights   06/03/20 1256  Weight: 231 lb 9.6 oz (105.1 kg)    GENERAL:alert, no distress and comfortable SKIN: skin color, texture, turgor are normal, no rashes or significant lesions EYES: normal, conjunctiva are pink and non-injected, sclera clear OROPHARYNX:no exudate, no erythema and lips, buccal mucosa, and tongue normal  NECK: supple, thyroid normal size, non-tender, without nodularity LYMPH:  no palpable  lymphadenopathy in the cervical, axillary or inguinal LUNGS: clear to auscultation and percussion with normal breathing effort HEART: regular rate & rhythm and no murmurs and no lower extremity edema ABDOMEN:abdomen soft, non-tender and normal bowel sounds Musculoskeletal:no cyanosis of digits and no clubbing  PSYCH: alert & oriented x 3 with fluent speech NEURO: no focal motor/sensory deficits  LABORATORY DATA:  I have reviewed the data as listed Lab Results  Component Value Date   WBC 12.4 (H) 06/03/2020   HGB 12.2 06/03/2020   HCT 36.8 06/03/2020   MCV 85.4 06/03/2020   PLT 499 (H) 06/03/2020     ASSESSMENT & PLAN Thrombocytosis Thrombocytosis seen could be reactive thrombocytosis secondary to iron deficiency versus primary bone marrow malignancy such as myeloproliferative disorder, examples being essential thrombocytosis I will order some basic blood work along with NexGen sequencing to look for myeloproliferative disorder Preliminary test result from today show borderline iron deficiency I recommend iron rich diet I will see her next week for further follow-up  Iron deficiency She has borderline iron deficiency  We discussed dietary modification I do not recommend oral iron supplement because it can exacerbate GI distress  Hyperlipidemia She has history of hyperlipidemia Due to recent diffuse exacerbation of her muscle and joint pain, pravastatin was placed on hold We discussed dietary modification For now, agree with her primary care doctor to put her medication on hold   Orders Placed This Encounter  Procedures  . CBC with Differential/Platelet    Standing Status:   Standing    Number of Occurrences:   22    Standing Expiration Date:   06/03/2021  . Ferritin    Standing Status:   Future    Number of Occurrences:   1    Standing Expiration Date:   06/03/2021  . Iron and TIBC    Standing Status:   Future    Number of Occurrences:   1    Standing Expiration Date:    06/03/2021  . Sedimentation rate    Standing Status:   Future    Number of Occurrences:   1    Standing Expiration Date:   06/03/2021  . JAK2 (including V617F and Exon 12), MPL, and CALR-Next Generation Sequencing    Standing Status:   Future    Number of Occurrences:   1    Standing Expiration Date:   06/03/2021   All questions were answered. The patient knows to call the clinic with any problems, questions or concerns. No barriers to learning was detected. The total time spent in the appointment was 55 minutes encounter with patients including review of chart and various tests results, discussions about plan of care and coordination of care plan  Heath Lark, MD 06/03/2020 3:30 PM

## 2020-06-03 ENCOUNTER — Encounter: Payer: Self-pay | Admitting: Hematology and Oncology

## 2020-06-03 ENCOUNTER — Inpatient Hospital Stay: Payer: Managed Care, Other (non HMO) | Attending: Hematology and Oncology | Admitting: Hematology and Oncology

## 2020-06-03 ENCOUNTER — Inpatient Hospital Stay: Payer: Managed Care, Other (non HMO)

## 2020-06-03 ENCOUNTER — Other Ambulatory Visit: Payer: Self-pay

## 2020-06-03 DIAGNOSIS — E785 Hyperlipidemia, unspecified: Secondary | ICD-10-CM | POA: Diagnosis not present

## 2020-06-03 DIAGNOSIS — D75839 Thrombocytosis, unspecified: Secondary | ICD-10-CM | POA: Diagnosis not present

## 2020-06-03 DIAGNOSIS — Z86018 Personal history of other benign neoplasm: Secondary | ICD-10-CM

## 2020-06-03 DIAGNOSIS — E611 Iron deficiency: Secondary | ICD-10-CM | POA: Diagnosis not present

## 2020-06-03 DIAGNOSIS — E041 Nontoxic single thyroid nodule: Secondary | ICD-10-CM

## 2020-06-03 LAB — CBC WITH DIFFERENTIAL/PLATELET
Abs Immature Granulocytes: 0.06 10*3/uL (ref 0.00–0.07)
Basophils Absolute: 0 10*3/uL (ref 0.0–0.1)
Basophils Relative: 0 %
Eosinophils Absolute: 0 10*3/uL (ref 0.0–0.5)
Eosinophils Relative: 0 %
HCT: 36.8 % (ref 36.0–46.0)
Hemoglobin: 12.2 g/dL (ref 12.0–15.0)
Immature Granulocytes: 1 %
Lymphocytes Relative: 9 %
Lymphs Abs: 1.2 10*3/uL (ref 0.7–4.0)
MCH: 28.3 pg (ref 26.0–34.0)
MCHC: 33.2 g/dL (ref 30.0–36.0)
MCV: 85.4 fL (ref 80.0–100.0)
Monocytes Absolute: 0.2 10*3/uL (ref 0.1–1.0)
Monocytes Relative: 2 %
Neutro Abs: 10.9 10*3/uL — ABNORMAL HIGH (ref 1.7–7.7)
Neutrophils Relative %: 88 %
Platelets: 499 10*3/uL — ABNORMAL HIGH (ref 150–400)
RBC: 4.31 MIL/uL (ref 3.87–5.11)
RDW: 13.8 % (ref 11.5–15.5)
WBC: 12.4 10*3/uL — ABNORMAL HIGH (ref 4.0–10.5)
nRBC: 0 % (ref 0.0–0.2)

## 2020-06-03 LAB — IRON AND TIBC
Iron: 34 ug/dL — ABNORMAL LOW (ref 41–142)
Saturation Ratios: 10 % — ABNORMAL LOW (ref 21–57)
TIBC: 351 ug/dL (ref 236–444)
UIBC: 317 ug/dL (ref 120–384)

## 2020-06-03 LAB — SEDIMENTATION RATE: Sed Rate: 15 mm/hr (ref 0–22)

## 2020-06-03 LAB — FERRITIN: Ferritin: 44 ng/mL (ref 11–307)

## 2020-06-03 NOTE — Assessment & Plan Note (Signed)
She has history of hyperlipidemia Due to recent diffuse exacerbation of her muscle and joint pain, pravastatin was placed on hold We discussed dietary modification For now, agree with her primary care doctor to put her medication on hold

## 2020-06-03 NOTE — Assessment & Plan Note (Signed)
Thrombocytosis seen could be reactive thrombocytosis secondary to iron deficiency versus primary bone marrow malignancy such as myeloproliferative disorder, examples being essential thrombocytosis I will order some basic blood work along with NexGen sequencing to look for myeloproliferative disorder Preliminary test result from today show borderline iron deficiency I recommend iron rich diet I will see her next week for further follow-up

## 2020-06-03 NOTE — Assessment & Plan Note (Signed)
She has borderline iron deficiency We discussed dietary modification I do not recommend oral iron supplement because it can exacerbate GI distress

## 2020-06-12 ENCOUNTER — Other Ambulatory Visit: Payer: Self-pay

## 2020-06-12 ENCOUNTER — Inpatient Hospital Stay: Payer: Managed Care, Other (non HMO) | Admitting: Hematology and Oncology

## 2020-06-12 DIAGNOSIS — D75839 Thrombocytosis, unspecified: Secondary | ICD-10-CM

## 2020-06-12 DIAGNOSIS — E611 Iron deficiency: Secondary | ICD-10-CM

## 2020-06-12 DIAGNOSIS — Z86018 Personal history of other benign neoplasm: Secondary | ICD-10-CM

## 2020-06-12 NOTE — Progress Notes (Signed)
[place] OFFICE PROGRESS NOTE  Mcclure, Andrea Barthel, DO  ASSESSMENT & PLAN:  Thrombocytosis I have ordered multiple tests to determine whether her thrombocytosis is reactive versus malignant Sedimentation rate is within normal limits She is noted to have borderline iron deficiency without signs of anemia Next generation sequencing panel did not reveal any abnormalities but it is not a perfect test The ultimate test to rule out primary bone marrow disorder would be bone marrow aspirate and biopsy For now, I do not recommend this We discussed the risk and benefit of aspirin therapy and close monitoring/observation along with iron rich diet to see if we could improve her blood count I will see her in 3 months for further follow-up  Iron deficiency Borderline low iron deficiency can contribute to reactive thrombocytosis From now, there is no indication for her to proceed with intravenous iron However, if she fails to improve her iron studies in her next visit, I will give her iron sucrose We discussed low dose oral iron supplement and iron rich diet  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 Iron and TIBC    Standing Status:   Standing    Number of Occurrences:   5    Standing Expiration Date:   06/13/2021  . Ferritin    Standing Status:   Standing    Number of Occurrences:   5    Standing Expiration Date:   06/13/2021    The total time spent in the appointment was 20 minutes encounter with patients including review of chart and various tests results, discussions about plan of care and coordination of care plan   All questions were answered. The patient knows to call the clinic with any problems, questions or concerns. No barriers to learning was detected.    Andrea Lark, MD 11/12/20217:54 AM  INTERVAL HISTORY: Andrea Mcclure 59 y.o. female returns for review of test results to follow-up on recent abnormal CBC showing chronic thrombocytosis She has completed prednisone therapy for her pain The prednisone did improve her musculoskeletal pain Both the patient and her husband have multiple questions related to test results Her husband is wondering whether she should receive intravenous iron infusion for quicker result The patient denies any recent signs or symptoms of bleeding such as spontaneous epistaxis, hematuria or hematochezia.   SUMMARY OF HEMATOLOGIC HISTORY:  She was found to have abnormal CBC from blood work monitoring by her primary care doctor I have the opportunity to review skin records On 10/20/2017, her platelet count was 460,000 On March 09, 2018, platelet count was 469,000 On May 23, 2020, her platelet count was 606,000 with normal CBC and hemoglobin She is noted to have history of pleomorphic adenoma status post resection and a thyroid nodule being monitored by ENT service  She denies recent bruising/bleeding, such as spontaneous epistaxis, hematuria, melena or hematochezia. She has some minor intermittent gum bleeding The patient denies history of liver disease, exposure to heparin, history of cardiac murmur/prior cardiovascular surgery or recent new medications She denies prior blood or platelet transfusions The patient have significant diffuse musculoskeletal pain especially after her recent Tdap/influenza vaccination Her pain was so severe that she was instructed to stop her cholesterol medicine and was started on prednisone recently that seems to help  Since her  parotid gland surgery, she did have some chronic dry mouth She have mild intermittent sinus drainage from allergies and occasional sore throat In November 2021, work-up show persistent thrombocytosis with borderline iron deficiency.  Next generation  sequencing did not review abnormal mutations, specifically, negative for ABL 1, JAK2, CAL-R and APL mutations  I have reviewed the past medical history, past surgical history, social history and family history with the patient and they are unchanged from previous note.  ALLERGIES:  is allergic to gabapentin, levaquin [levofloxacin], metformin and related, tizanidine, venlafaxine, wellbutrin [bupropion], and biofreeze [menthol (topical analgesic)].  MEDICATIONS:  Current Outpatient Medications  Medication Sig Dispense Refill  . aspirin 325 MG tablet Take 325 mg by mouth daily.    . Cholecalciferol (VITAMIN D) 2000 units CAPS Take 1 capsule by mouth once a week.    Marland Kitchen FLUoxetine (PROZAC) 40 MG capsule Take 40 mg by mouth daily.    Marland Kitchen losartan (COZAAR) 50 MG tablet Take 50 mg by mouth daily.    Marland Kitchen oxyCODONE-acetaminophen (PERCOCET/ROXICET) 5-325 MG tablet Take 1-2 tablets by mouth every 4 (four) hours as needed for severe pain.    . predniSONE (DELTASONE) 20 MG tablet Take 20 mg by mouth daily.     No current facility-administered medications for this visit.     REVIEW OF SYSTEMS:   Constitutional: Denies fevers, chills or night sweats Eyes: Denies blurriness of vision Ears, nose, mouth, throat, and face: Denies mucositis or sore throat Respiratory: Denies cough, dyspnea or wheezes Cardiovascular: Denies palpitation, chest discomfort or lower extremity swelling Gastrointestinal:  Denies nausea, heartburn or change in bowel habits Skin: Denies abnormal skin rashes Lymphatics: Denies new lymphadenopathy or easy bruising Neurological:Denies numbness, tingling or new weaknesses Behavioral/Psych: Mood is stable, no new changes  All other systems were reviewed with the patient and are negative.  PHYSICAL EXAMINATION: ECOG PERFORMANCE STATUS: 0 - Asymptomatic  Vitals:   06/12/20 1449  BP: 130/82  Pulse: 91  Resp: 16  Temp: 98.7 F (37.1 C)  SpO2: 98%   Filed Weights   06/12/20 1449   Weight: 232 lb 6.4 oz (105.4 kg)    GENERAL:alert, no distress and comfortable Musculoskeletal:no cyanosis of digits and no clubbing  NEURO: alert & oriented x 3 with fluent speech, no focal motor/sensory deficits  LABORATORY DATA:  I have reviewed the data as listed     Component Value Date/Time   NA 136 07/24/2016 1748   K 4.9 07/24/2016 1748   CL 103 07/24/2016 1748   CO2 23 07/24/2016 1748   GLUCOSE 144 (H) 07/24/2016 1748   BUN 20 07/24/2016 1748   CREATININE 1.07 (H) 07/24/2016 1748   CALCIUM 9.3 07/24/2016 1748   GFRNONAA 58 (L) 07/24/2016 1748   GFRAA >60 07/24/2016 1748    No results found for: SPEP, UPEP  Lab Results  Component Value Date   WBC 12.4 (H) 06/03/2020   NEUTROABS 10.9 (H) 06/03/2020   HGB 12.2 06/03/2020   HCT 36.8 06/03/2020   MCV 85.4 06/03/2020   PLT 499 (H) 06/03/2020      Chemistry      Component Value Date/Time   NA 136 07/24/2016 1748   K 4.9 07/24/2016 1748   CL 103 07/24/2016 1748   CO2 23 07/24/2016 1748   BUN 20 07/24/2016 1748   CREATININE 1.07 (H) 07/24/2016 1748      Component Value Date/Time   CALCIUM 9.3 07/24/2016 1748

## 2020-06-12 NOTE — Assessment & Plan Note (Addendum)
Borderline low iron deficiency can contribute to reactive thrombocytosis From now, there is no indication for her to proceed with intravenous iron However, if she fails to improve her iron studies in her next visit, I will give her iron sucrose We discussed low dose oral iron supplement and iron rich diet

## 2020-06-12 NOTE — Assessment & Plan Note (Addendum)
I have ordered multiple tests to determine whether her thrombocytosis is reactive versus malignant Sedimentation rate is within normal limits She is noted to have borderline iron deficiency without signs of anemia Next generation sequencing panel did not reveal any abnormalities but it is not a perfect test The ultimate test to rule out primary bone marrow disorder would be bone marrow aspirate and biopsy For now, I do not recommend this We discussed the risk and benefit of aspirin therapy and close monitoring/observation along with iron rich diet to see if we could improve her blood count I will see her in 3 months for further follow-up

## 2020-06-13 ENCOUNTER — Encounter: Payer: Self-pay | Admitting: Hematology and Oncology

## 2020-06-13 NOTE — Assessment & Plan Note (Signed)
Status post surgery This is benign in nature and does not need long-term follow-up It is not a contributing factor for her chronic thrombocytosis

## 2020-06-16 LAB — JAK2 (INCLUDING V617F AND EXON 12), MPL,& CALR-NEXT GEN SEQ

## 2020-09-08 ENCOUNTER — Other Ambulatory Visit: Payer: Self-pay

## 2020-09-08 ENCOUNTER — Inpatient Hospital Stay: Payer: Managed Care, Other (non HMO) | Attending: Hematology and Oncology

## 2020-09-08 DIAGNOSIS — D509 Iron deficiency anemia, unspecified: Secondary | ICD-10-CM | POA: Diagnosis present

## 2020-09-08 DIAGNOSIS — D75839 Thrombocytosis, unspecified: Secondary | ICD-10-CM | POA: Insufficient documentation

## 2020-09-08 DIAGNOSIS — E611 Iron deficiency: Secondary | ICD-10-CM

## 2020-09-08 LAB — CBC WITH DIFFERENTIAL/PLATELET
Abs Immature Granulocytes: 0.03 10*3/uL (ref 0.00–0.07)
Basophils Absolute: 0 10*3/uL (ref 0.0–0.1)
Basophils Relative: 0 %
Eosinophils Absolute: 0.2 10*3/uL (ref 0.0–0.5)
Eosinophils Relative: 2 %
HCT: 36 % (ref 36.0–46.0)
Hemoglobin: 11.7 g/dL — ABNORMAL LOW (ref 12.0–15.0)
Immature Granulocytes: 0 %
Lymphocytes Relative: 33 %
Lymphs Abs: 2.8 10*3/uL (ref 0.7–4.0)
MCH: 28.3 pg (ref 26.0–34.0)
MCHC: 32.5 g/dL (ref 30.0–36.0)
MCV: 87 fL (ref 80.0–100.0)
Monocytes Absolute: 0.6 10*3/uL (ref 0.1–1.0)
Monocytes Relative: 7 %
Neutro Abs: 5 10*3/uL (ref 1.7–7.7)
Neutrophils Relative %: 58 %
Platelets: 428 10*3/uL — ABNORMAL HIGH (ref 150–400)
RBC: 4.14 MIL/uL (ref 3.87–5.11)
RDW: 13.2 % (ref 11.5–15.5)
WBC: 8.6 10*3/uL (ref 4.0–10.5)
nRBC: 0 % (ref 0.0–0.2)

## 2020-09-09 ENCOUNTER — Inpatient Hospital Stay: Payer: Managed Care, Other (non HMO)

## 2020-09-09 ENCOUNTER — Inpatient Hospital Stay: Payer: Managed Care, Other (non HMO) | Admitting: Hematology and Oncology

## 2020-09-09 ENCOUNTER — Other Ambulatory Visit: Payer: Self-pay | Admitting: Hematology and Oncology

## 2020-09-09 ENCOUNTER — Encounter: Payer: Self-pay | Admitting: Hematology and Oncology

## 2020-09-09 ENCOUNTER — Other Ambulatory Visit: Payer: Self-pay

## 2020-09-09 DIAGNOSIS — E611 Iron deficiency: Secondary | ICD-10-CM | POA: Diagnosis not present

## 2020-09-09 DIAGNOSIS — D75839 Thrombocytosis, unspecified: Secondary | ICD-10-CM | POA: Diagnosis not present

## 2020-09-09 LAB — FERRITIN: Ferritin: 53 ng/mL (ref 11–307)

## 2020-09-09 LAB — IRON AND TIBC
Iron: 66 ug/dL (ref 41–142)
Saturation Ratios: 23 % (ref 21–57)
TIBC: 281 ug/dL (ref 236–444)
UIBC: 215 ug/dL (ref 120–384)

## 2020-09-09 NOTE — Assessment & Plan Note (Signed)
I reviewed all her blood work extensively Even though her iron studies are a bit better, there is still a component of iron deficiency Her iron deficiency is likely the cause of her fatigue, persistent anemia as well as reactive thrombocytosis. The most likely cause of her anemia is due to chronic blood loss/malabsorption syndrome. We discussed some of the risks, benefits, and alternatives of intravenous iron infusions. The patient is symptomatic from anemia and the iron level is critically low. She tolerated oral iron supplement poorly and desires to achieved higher levels of iron faster for adequate hematopoesis. Some of the side-effects to be expected including risks of infusion reactions, phlebitis, headaches, nausea and fatigue.  The patient is willing to proceed. Patient education material was dispensed.  Goal is to keep ferritin level greater than 50 and resolution of anemia Due to her other appointments, she is not able to keep her appointment today We will reschedule her IV iron to next week I plan to recheck her blood work again in about 2 months

## 2020-09-09 NOTE — Progress Notes (Signed)
[place] OFFICE PROGRESS NOTE  Masneri, [name], DO  ASSESSMENT & PLAN:  Iron deficiency I reviewed all her blood work extensively Even though her iron studies are a bit better, there is still a component of iron deficiency Her iron deficiency is likely the cause of her fatigue, persistent anemia as well as reactive thrombocytosis. The most likely cause of her anemia is due to chronic blood loss/malabsorption syndrome. We discussed some of the risks, benefits, and alternatives of intravenous iron infusions. The patient is symptomatic from anemia and the iron level is critically low. She tolerated oral iron supplement poorly and desires to achieved higher levels of iron faster for adequate hematopoesis. Some of the side-effects to be expected including risks of infusion reactions, phlebitis, headaches, nausea and fatigue.  The patient is willing to proceed. Patient education material was dispensed.  Goal is to keep ferritin level greater than 50 and resolution of anemia Due to her other appointments, she is not able to keep her appointment today We will reschedule her IV iron to next week I plan to recheck her blood work again in about 2 months   Thrombocytosis She had extensive evaluation in the past for this Sedimentation rate is within normal limits Next generation sequencing panel did not reveal any abnormalities  The ultimate test to rule out primary bone marrow disorder would be bone marrow aspirate and biopsy For now, I do not recommend this I recommend intravenous iron infusion I suspect her thrombocytosis is reactive in nature I anticipate it will improve back to normal after her iron stores replenished   No orders of the defined types were placed in this encounter.   The total time spent in the appointment was 20 minutes encounter with patients including review of chart and various tests results, discussions about plan of care and coordination of care  plan   All questions were answered. The patient knows to call the clinic with any problems, questions or concerns. No barriers to learning was detected.    Heath Lark, MD 2/8/20223:17 PM  INTERVAL HISTORY: Andrea Mcclure 60 y.o. female returns for further follow-up on abnormal CBC, likely iron deficiency anemia with reactive thrombocytosis She is here with her husband She could not tolerate oral iron supplement as it made her sick She denies recent NSAID The patient denies any recent signs or symptoms of bleeding such as spontaneous epistaxis, hematuria or hematochezia.  She is mildly symptomatic from anemia with fatigue SUMMARY OF HEMATOLOGIC HISTORY:  She was found to have abnormal CBC from blood work monitoring by her primary care doctor I have the opportunity to review skin records On 10/20/2017, her platelet count was 460,000 On March 09, 2018, platelet count was 469,000 On May 23, 2020, her platelet count was 606,000 with normal CBC and hemoglobin She is noted to have history of pleomorphic adenoma status post resection and a thyroid nodule being monitored by ENT service She underwent extensive work-up in 2021.  Overall, her CBC is most consistent with iron deficiency anemia with reactive thrombocytosis  I have reviewed the past medical history, past surgical history, social history and family history with the patient and they are unchanged from previous note.  ALLERGIES:  is allergic to gabapentin, levaquin [levofloxacin], metformin and related, tizanidine, venlafaxine, wellbutrin [bupropion], and biofreeze [menthol (topical analgesic)].  MEDICATIONS:  Current Outpatient Medications  Medication Sig Dispense Refill  . aspirin 325 MG tablet Take 325 mg by mouth daily.    . Cholecalciferol (VITAMIN  D) 2000 units CAPS Take 1 capsule by mouth once a week.    Marland Kitchen FLUoxetine (PROZAC) 40 MG capsule Take 40 mg by mouth daily.    Marland Kitchen losartan (COZAAR) 50 MG tablet Take 50 mg by  mouth daily.    Marland Kitchen oxyCODONE-acetaminophen (PERCOCET/ROXICET) 5-325 MG tablet Take 1-2 tablets by mouth every 4 (four) hours as needed for severe pain.    . predniSONE (DELTASONE) 20 MG tablet Take 20 mg by mouth daily.     No current facility-administered medications for this visit.     REVIEW OF SYSTEMS:   Constitutional: Denies fevers, chills or night sweats Eyes: Denies blurriness of vision Ears, nose, mouth, throat, and face: Denies mucositis or sore throat Respiratory: Denies cough, dyspnea or wheezes Cardiovascular: Denies palpitation, chest discomfort or lower extremity swelling Gastrointestinal:  Denies nausea, heartburn or change in bowel habits Skin: Denies abnormal skin rashes Lymphatics: Denies new lymphadenopathy or easy bruising Neurological:Denies numbness, tingling or new weaknesses Behavioral/Psych: Mood is stable, no new changes  All other systems were reviewed with the patient and are negative.  PHYSICAL EXAMINATION: ECOG PERFORMANCE STATUS: 1 - Symptomatic but completely ambulatory  Vitals:   09/09/20 1306  BP: 132/78  Pulse: 81  Resp: 18  Temp: 99.3 F (37.4 C)  SpO2: 99%   Filed Weights   09/09/20 1306  Weight: 230 lb (104.3 kg)    GENERAL:alert, no distress and comfortable NEURO: alert & oriented x 3 with fluent speech, no focal motor/sensory deficits  LABORATORY DATA:  I have reviewed the data as listed     Component Value Date/Time   NA 136 07/24/2016 1748   K 4.9 07/24/2016 1748   CL 103 07/24/2016 1748   CO2 23 07/24/2016 1748   GLUCOSE 144 (H) 07/24/2016 1748   BUN 20 07/24/2016 1748   CREATININE 1.07 (H) 07/24/2016 1748   CALCIUM 9.3 07/24/2016 1748   GFRNONAA 58 (L) 07/24/2016 1748   GFRAA >60 07/24/2016 1748    No results found for: SPEP, UPEP  Lab Results  Component Value Date   WBC 8.6 09/08/2020   NEUTROABS 5.0 09/08/2020   HGB 11.7 (L) 09/08/2020   HCT 36.0 09/08/2020   MCV 87.0 09/08/2020   PLT 428 (H) 09/08/2020       Chemistry      Component Value Date/Time   NA 136 07/24/2016 1748   K 4.9 07/24/2016 1748   CL 103 07/24/2016 1748   CO2 23 07/24/2016 1748   BUN 20 07/24/2016 1748   CREATININE 1.07 (H) 07/24/2016 1748      Component Value Date/Time   CALCIUM 9.3 07/24/2016 1748

## 2020-09-09 NOTE — Assessment & Plan Note (Signed)
She had extensive evaluation in the past for this Sedimentation rate is within normal limits Next generation sequencing panel did not reveal any abnormalities  The ultimate test to rule out primary bone marrow disorder would be bone marrow aspirate and biopsy For now, I do not recommend this I recommend intravenous iron infusion I suspect her thrombocytosis is reactive in nature I anticipate it will improve back to normal after her iron stores replenished

## 2020-09-16 ENCOUNTER — Inpatient Hospital Stay: Payer: Managed Care, Other (non HMO)

## 2020-09-16 ENCOUNTER — Other Ambulatory Visit: Payer: Self-pay

## 2020-09-16 VITALS — BP 156/92 | HR 75 | Temp 98.6°F | Resp 18

## 2020-09-16 DIAGNOSIS — D509 Iron deficiency anemia, unspecified: Secondary | ICD-10-CM | POA: Diagnosis not present

## 2020-09-16 DIAGNOSIS — E611 Iron deficiency: Secondary | ICD-10-CM

## 2020-09-16 MED ORDER — SODIUM CHLORIDE 0.9 % IV SOLN
200.0000 mg | Freq: Once | INTRAVENOUS | Status: AC
  Administered 2020-09-16: 200 mg via INTRAVENOUS
  Filled 2020-09-16: qty 200

## 2020-09-16 MED ORDER — SODIUM CHLORIDE 0.9 % IV SOLN
Freq: Once | INTRAVENOUS | Status: AC
  Filled 2020-09-16: qty 250

## 2020-09-16 NOTE — Patient Instructions (Signed)

## 2020-09-19 ENCOUNTER — Inpatient Hospital Stay: Payer: Managed Care, Other (non HMO)

## 2020-09-19 ENCOUNTER — Other Ambulatory Visit: Payer: Self-pay

## 2020-09-19 VITALS — BP 137/80 | HR 76 | Temp 97.9°F | Resp 18

## 2020-09-19 DIAGNOSIS — E611 Iron deficiency: Secondary | ICD-10-CM

## 2020-09-19 DIAGNOSIS — D509 Iron deficiency anemia, unspecified: Secondary | ICD-10-CM | POA: Diagnosis not present

## 2020-09-19 MED ORDER — SODIUM CHLORIDE 0.9 % IV SOLN
200.0000 mg | Freq: Once | INTRAVENOUS | Status: AC
  Administered 2020-09-19: 200 mg via INTRAVENOUS
  Filled 2020-09-19: qty 200

## 2020-09-19 MED ORDER — SODIUM CHLORIDE 0.9 % IV SOLN
Freq: Once | INTRAVENOUS | Status: AC
  Filled 2020-09-19: qty 250

## 2020-09-19 NOTE — Patient Instructions (Signed)
Iron Dextran injection °What is this medicine? °IRON DEXTRAN (AHY ern DEX tran) is an iron complex. Iron is used to make healthy red blood cells, which carry oxygen and nutrients through the body. This medicine is used to treat people who cannot take iron by mouth and have low levels of iron in the blood. °This medicine may be used for other purposes; ask your health care provider or pharmacist if you have questions. °COMMON BRAND NAME(S): Dexferrum, INFeD °What should I tell my health care provider before I take this medicine? °They need to know if you have any of these conditions: °· anemia not caused by low iron levels °· heart disease °· high levels of iron in the blood °· kidney disease °· liver disease °· an unusual or allergic reaction to iron, other medicines, foods, dyes, or preservatives °· pregnant or trying to get pregnant °· breast-feeding °How should I use this medicine? °This medicine is for injection into a vein or a muscle. It is given by a health care professional in a hospital or clinic setting. °Talk to your pediatrician regarding the use of this medicine in children. While this drug may be prescribed for children as young as 4 months old for selected conditions, precautions do apply. °Overdosage: If you think you have taken too much of this medicine contact a poison control center or emergency room at once. °NOTE: This medicine is only for you. Do not share this medicine with others. °What if I miss a dose? °It is important not to miss your dose. Call your doctor or health care professional if you are unable to keep an appointment. °What may interact with this medicine? °Do not take this medicine with any of the following medications: °· deferoxamine °· dimercaprol °· other iron products °This medicine may also interact with the following medications: °· chloramphenicol °· deferasirox °This list may not describe all possible interactions. Give your health care provider a list of all the  medicines, herbs, non-prescription drugs, or dietary supplements you use. Also tell them if you smoke, drink alcohol, or use illegal drugs. Some items may interact with your medicine. °What should I watch for while using this medicine? °Visit your doctor or health care professional regularly. Tell your doctor if your symptoms do not start to get better or if they get worse. You may need blood work done while you are taking this medicine. °You may need to follow a special diet. Talk to your doctor. Foods that contain iron include: whole grains/cereals, dried fruits, beans, or peas, leafy green vegetables, and organ meats (liver, kidney). °Long-term use of this medicine may increase your risk of some cancers. Talk to your doctor about how to limit your risk. °What side effects may I notice from receiving this medicine? °Side effects that you should report to your doctor or health care professional as soon as possible: °· allergic reactions like skin rash, itching or hives, swelling of the face, lips, or tongue °· blue lips, nails, or skin °· breathing problems °· changes in blood pressure °· chest pain °· confusion °· fast, irregular heartbeat °· feeling faint or lightheaded, falls °· fever or chills °· flushing, sweating, or hot feelings °· joint or muscle aches or pains °· pain, tingling, numbness in the hands or feet °· seizures °· unusually weak or tired °Side effects that usually do not require medical attention (report to your doctor or health care professional if they continue or are bothersome): °· change in taste (metallic taste) °·   diarrhea °· headache °· irritation at site where injected °· nausea, vomiting °· stomach upset °This list may not describe all possible side effects. Call your doctor for medical advice about side effects. You may report side effects to FDA at 1-800-FDA-1088. °Where should I keep my medicine? °This drug is given in a hospital or clinic and will not be stored at home. °NOTE: This  sheet is a summary. It may not cover all possible information. If you have questions about this medicine, talk to your doctor, pharmacist, or health care provider. °© 2021 [employer] (2007-12-05 16:59:50) ° °

## 2020-11-17 ENCOUNTER — Telehealth: Payer: Self-pay | Admitting: Hematology and Oncology

## 2020-11-17 NOTE — Telephone Encounter (Signed)
Called pt to r/s appt per 4/18 sch msg. No answer. Left msg for pt to call back to r/s.

## 2020-11-17 NOTE — Telephone Encounter (Signed)
R/s appt per 4/18 sch msg. Pt aware.  

## 2020-11-18 ENCOUNTER — Inpatient Hospital Stay: Payer: Managed Care, Other (non HMO)

## 2020-11-18 ENCOUNTER — Inpatient Hospital Stay: Payer: Managed Care, Other (non HMO) | Admitting: Hematology and Oncology

## 2020-11-24 ENCOUNTER — Inpatient Hospital Stay: Payer: Managed Care, Other (non HMO) | Admitting: Hematology and Oncology

## 2020-11-24 ENCOUNTER — Telehealth: Payer: Self-pay | Admitting: Hematology and Oncology

## 2020-11-24 ENCOUNTER — Inpatient Hospital Stay: Payer: Managed Care, Other (non HMO)

## 2020-11-24 NOTE — Telephone Encounter (Signed)
R/s appt per 4/25 sch msg. Pt aware.  

## 2020-12-03 ENCOUNTER — Encounter: Payer: Self-pay | Admitting: Hematology and Oncology

## 2020-12-03 ENCOUNTER — Other Ambulatory Visit: Payer: Self-pay

## 2020-12-03 ENCOUNTER — Inpatient Hospital Stay: Payer: Managed Care, Other (non HMO) | Attending: Hematology and Oncology

## 2020-12-03 ENCOUNTER — Inpatient Hospital Stay: Payer: Managed Care, Other (non HMO) | Admitting: Hematology and Oncology

## 2020-12-03 DIAGNOSIS — E611 Iron deficiency: Secondary | ICD-10-CM | POA: Diagnosis not present

## 2020-12-03 DIAGNOSIS — D75839 Thrombocytosis, unspecified: Secondary | ICD-10-CM

## 2020-12-03 DIAGNOSIS — G894 Chronic pain syndrome: Secondary | ICD-10-CM

## 2020-12-03 DIAGNOSIS — D509 Iron deficiency anemia, unspecified: Secondary | ICD-10-CM | POA: Diagnosis present

## 2020-12-03 LAB — CBC WITH DIFFERENTIAL/PLATELET
Abs Immature Granulocytes: 0.01 10*3/uL (ref 0.00–0.07)
Basophils Absolute: 0 10*3/uL (ref 0.0–0.1)
Basophils Relative: 0 %
Eosinophils Absolute: 0.2 10*3/uL (ref 0.0–0.5)
Eosinophils Relative: 3 %
HCT: 37.7 % (ref 36.0–46.0)
Hemoglobin: 12.3 g/dL (ref 12.0–15.0)
Immature Granulocytes: 0 %
Lymphocytes Relative: 30 %
Lymphs Abs: 2.2 10*3/uL (ref 0.7–4.0)
MCH: 28.6 pg (ref 26.0–34.0)
MCHC: 32.6 g/dL (ref 30.0–36.0)
MCV: 87.7 fL (ref 80.0–100.0)
Monocytes Absolute: 0.4 10*3/uL (ref 0.1–1.0)
Monocytes Relative: 5 %
Neutro Abs: 4.6 10*3/uL (ref 1.7–7.7)
Neutrophils Relative %: 62 %
Platelets: 419 10*3/uL — ABNORMAL HIGH (ref 150–400)
RBC: 4.3 MIL/uL (ref 3.87–5.11)
RDW: 13.2 % (ref 11.5–15.5)
WBC: 7.4 10*3/uL (ref 4.0–10.5)
nRBC: 0 % (ref 0.0–0.2)

## 2020-12-03 LAB — IRON AND TIBC
Iron: 90 ug/dL (ref 28–170)
Saturation Ratios: 26 % (ref 10.4–31.8)
TIBC: 351 ug/dL (ref 250–450)
UIBC: 261 ug/dL

## 2020-12-03 LAB — FERRITIN: Ferritin: 79 ng/mL (ref 11–307)

## 2020-12-03 NOTE — Assessment & Plan Note (Signed)
She had extensive evaluation in the past for this Sedimentation rate is within normal limits Next generation sequencing panel did not reveal any abnormalities  I suspect her thrombocytosis is reactive in nature; with improvement of anemia and iron stores, it is almost completely back to normal Recommend close observation only Plan to recheck it again in 6 months

## 2020-12-03 NOTE — Progress Notes (Signed)
HEMATOLOGY-ONCOLOGY ELECTRONIC VISIT PROGRESS NOTE  Patient Care Team: [name], MD as PCP - General (Internal Medicine)  I connected with by [facility] video conference and verified that I am speaking with the correct person using two identifiers.  I discussed the limitations, risks, security and privacy concerns of performing an evaluation and management service by [facility] and the availability of in person appointments.  I also discussed with the patient that there may be a patient responsible charge related to this service. The patient expressed understanding and agreed to proceed.   ASSESSMENT & PLAN:  Iron deficiency She is no longer anemic and her iron studies has improved She does not need further IV iron Observe closely I plan to recheck her iron studies again in 6 months  Thrombocytosis She had extensive evaluation in the past for this Sedimentation rate is within normal limits Next generation sequencing panel did not reveal any abnormalities  I suspect her thrombocytosis is reactive in nature; with improvement of anemia and iron stores, it is almost completely back to normal Recommend close observation only Plan to recheck it again in 6 months  Chronic pain syndrome She has chronic diffuse musculoskeletal pain and has taken prednisone recently I would defer to her primary care doctor for management   No orders of the defined types were placed in this encounter.   INTERVAL HISTORY: Please see below for problem oriented charting. The purpose of today's visit is to review test results Since last time I saw her, she has not been feeling well She has occasional severe joint pain She has fatigue The patient denies any recent signs or symptoms of bleeding such as spontaneous epistaxis, hematuria or hematochezia.  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  Overall, her CBC is most consistent with iron deficiency anemia with reactive thrombocytosis In February 2022, she received 2 doses of IV iron infusion On 12/03/2020, she has resolution of iron deficiency anemia.  Thrombocytosis is almost completely resolved  REVIEW OF SYSTEMS:   Constitutional: Denies fevers, chills or abnormal weight loss Eyes: Denies blurriness of vision Ears, nose, mouth, throat, and face: Denies mucositis or sore throat Respiratory: Denies cough, dyspnea or wheezes Cardiovascular: Denies palpitation, chest discomfort Gastrointestinal:  Denies nausea, heartburn or change in bowel habits Skin: Denies abnormal skin rashes Lymphatics: Denies new lymphadenopathy or easy bruising Neurological:Denies numbness, tingling or new weaknesses Behavioral/Psych: Mood is stable, no new changes  Extremities: No lower extremity edema All other systems were reviewed with the patient and are negative.  I have reviewed the past medical history, past surgical history, social history and family history with the patient and they are unchanged from previous note.  ALLERGIES:  is allergic to gabapentin, levaquin [levofloxacin], metformin and related, tizanidine, venlafaxine, wellbutrin [bupropion], and biofreeze [menthol (topical analgesic)].  MEDICATIONS:  Current Outpatient Medications  Medication Sig Dispense Refill  . aspirin 325 MG tablet Take 325 mg by mouth daily.    . Cholecalciferol (VITAMIN D) 2000 units CAPS Take 1 capsule by mouth once a week.    Marland Kitchen  FLUoxetine (PROZAC) 40 MG capsule Take 40 mg by mouth daily.    Marland Kitchen losartan (COZAAR) 50  MG tablet Take 50 mg by mouth daily.    Marland Kitchen oxyCODONE-acetaminophen (PERCOCET/ROXICET) 5-325 MG tablet Take 1-2 tablets by mouth every 4 (four) hours as needed for severe pain.    . predniSONE (DELTASONE) 20 MG tablet Take 20 mg by mouth daily.     No current facility-administered medications for this visit.    PHYSICAL EXAMINATION: ECOG PERFORMANCE STATUS: 0 - Asymptomatic  LABORATORY DATA:  I have reviewed the data as listed CMP Latest Ref Rng & Units 07/24/2016  Glucose 65 - 99 mg/dL 144(H)  BUN 6 - 20 mg/dL 20  Creatinine 0.44 - 1.00 mg/dL 1.07(H)  Sodium 135 - 145 mmol/L 136  Potassium 3.5 - 5.1 mmol/L 4.9  Chloride 101 - 111 mmol/L 103  CO2 22 - 32 mmol/L 23  Calcium 8.9 - 10.3 mg/dL 9.3    Lab Results  Component Value Date   WBC 7.4 12/03/2020   HGB 12.3 12/03/2020   HCT 37.7 12/03/2020   MCV 87.7 12/03/2020   PLT 419 (H) 12/03/2020   NEUTROABS 4.6 12/03/2020     I discussed the assessment and treatment plan with the patient. The patient was provided an opportunity to ask questions and all were answered. The patient agreed with the plan and demonstrated an understanding of the instructions. The patient was advised to call back or seek an in-person evaluation if the symptoms worsen or if the condition fails to improve as anticipated.    I spent 20 minutes for the appointment reviewing test results, discuss management and coordination of care.  Heath Lark, MD 12/03/2020 1:26 PM

## 2020-12-03 NOTE — Assessment & Plan Note (Signed)
She has chronic diffuse musculoskeletal pain and has taken prednisone recently I would defer to her primary care doctor for management

## 2020-12-03 NOTE — Assessment & Plan Note (Signed)
She is no longer anemic and her iron studies has improved She does not need further IV iron Observe closely I plan to recheck her iron studies again in 6 months

## 2020-12-04 ENCOUNTER — Telehealth: Payer: Self-pay | Admitting: Hematology and Oncology

## 2020-12-04 NOTE — Telephone Encounter (Signed)
Scheduled appt per 5/4 sch msg. Pt aware.  

## 2021-06-02 ENCOUNTER — Telehealth: Payer: Self-pay

## 2021-06-02 NOTE — Telephone Encounter (Signed)
Called and left a message asking her to call the office back regarding 11/7 appts. Dr. Alvy Bimler appt needs to be moved to a different time.

## 2021-06-02 NOTE — Telephone Encounter (Signed)
Called and left message. 11/7 appt moved to 2:45 pm on 11/7. Ask her to call the office back if this does not work for her.

## 2021-06-03 ENCOUNTER — Telehealth: Payer: Self-pay | Admitting: Hematology and Oncology

## 2021-06-03 NOTE — Telephone Encounter (Signed)
Cancelled per 11/2 pt request, pt does not want to r/s at this time. Pt will call back if they want to r/s

## 2021-06-08 ENCOUNTER — Inpatient Hospital Stay: Payer: Managed Care, Other (non HMO)

## 2021-06-08 ENCOUNTER — Inpatient Hospital Stay: Payer: Managed Care, Other (non HMO) | Admitting: Hematology and Oncology

## 2021-06-08 ENCOUNTER — Telehealth: Payer: Managed Care, Other (non HMO) | Admitting: Hematology and Oncology

## 2021-09-02 ENCOUNTER — Telehealth: Payer: Self-pay | Admitting: Hematology and Oncology

## 2021-09-02 NOTE — Telephone Encounter (Signed)
Sch per 1/30 inbasket, left msg with pt
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
## 2021-09-10 ENCOUNTER — Telehealth: Payer: Self-pay

## 2021-09-10 NOTE — Telephone Encounter (Signed)
Called and left a message asking her to call the office back. Virtual visit for tomorrow canceled at 1220. Dr. Alvy Bimler is unable to do the visit due to her not having a lab appt. She needs the lab results to be able to do a virtual visit.

## 2021-09-11 ENCOUNTER — Inpatient Hospital Stay: Payer: Managed Care, Other (non HMO) | Admitting: Hematology and Oncology

## 2021-10-15 ENCOUNTER — Telehealth: Payer: Self-pay | Admitting: Hematology and Oncology

## 2021-10-15 NOTE — Telephone Encounter (Signed)
.  Called pt per 3/15 inbasket , Patient was unavailable, a message with appt time and date was left with number on file.   ?

## 2021-11-05 ENCOUNTER — Inpatient Hospital Stay: Payer: Managed Care, Other (non HMO)

## 2021-11-05 ENCOUNTER — Telehealth: Payer: Self-pay

## 2021-11-05 ENCOUNTER — Inpatient Hospital Stay: Payer: Managed Care, Other (non HMO) | Attending: Hematology and Oncology | Admitting: Hematology and Oncology

## 2021-11-05 ENCOUNTER — Other Ambulatory Visit: Payer: Self-pay

## 2021-11-05 ENCOUNTER — Other Ambulatory Visit: Payer: Self-pay | Admitting: Hematology and Oncology

## 2021-11-05 ENCOUNTER — Encounter: Payer: Self-pay | Admitting: Hematology and Oncology

## 2021-11-05 DIAGNOSIS — D75839 Thrombocytosis, unspecified: Secondary | ICD-10-CM

## 2021-11-05 DIAGNOSIS — E611 Iron deficiency: Secondary | ICD-10-CM | POA: Diagnosis not present

## 2021-11-05 DIAGNOSIS — D75838 Other thrombocytosis: Secondary | ICD-10-CM | POA: Diagnosis present

## 2021-11-05 LAB — CBC WITH DIFFERENTIAL/PLATELET
Abs Immature Granulocytes: 0.02 10*3/uL (ref 0.00–0.07)
Basophils Absolute: 0 10*3/uL (ref 0.0–0.1)
Basophils Relative: 0 %
Eosinophils Absolute: 0.1 10*3/uL (ref 0.0–0.5)
Eosinophils Relative: 1 %
HCT: 36.5 % (ref 36.0–46.0)
Hemoglobin: 12.1 g/dL (ref 12.0–15.0)
Immature Granulocytes: 0 %
Lymphocytes Relative: 26 %
Lymphs Abs: 2 10*3/uL (ref 0.7–4.0)
MCH: 29.1 pg (ref 26.0–34.0)
MCHC: 33.2 g/dL (ref 30.0–36.0)
MCV: 87.7 fL (ref 80.0–100.0)
Monocytes Absolute: 0.5 10*3/uL (ref 0.1–1.0)
Monocytes Relative: 6 %
Neutro Abs: 5.4 10*3/uL (ref 1.7–7.7)
Neutrophils Relative %: 67 %
Platelets: 425 10*3/uL — ABNORMAL HIGH (ref 150–400)
RBC: 4.16 MIL/uL (ref 3.87–5.11)
RDW: 12.9 % (ref 11.5–15.5)
WBC: 8 10*3/uL (ref 4.0–10.5)
nRBC: 0 % (ref 0.0–0.2)

## 2021-11-05 LAB — IRON AND IRON BINDING CAPACITY (CC-WL,HP ONLY)
Iron: 90 ug/dL (ref 28–170)
Saturation Ratios: 27 % (ref 10.4–31.8)
TIBC: 337 ug/dL (ref 250–450)
UIBC: 247 ug/dL (ref 148–442)

## 2021-11-05 LAB — FERRITIN: Ferritin: 121 ng/mL (ref 11–307)

## 2021-11-05 NOTE — Assessment & Plan Note (Signed)
Repeat iron studies are adequate ?She does not need long-term oral iron supplement ?

## 2021-11-05 NOTE — Assessment & Plan Note (Signed)
She had extensive evaluation in the past for this ?Sedimentation rate is within normal limits ?Next generation sequencing panel did not reveal any abnormalities  ?I suspect her thrombocytosis is reactive in nature; with improvement of anemia and iron stores, it is almost completely back to normal ?The patient also have inflammatory arthritis requiring prednisone treatment intermittently ?Recommend close observation only ?She does not need long-term follow-up ?The patient is reassured ?

## 2021-11-05 NOTE — Progress Notes (Signed)
? ?[place] ?OFFICE PROGRESS NOTE ? ?[name], MD ? ?ASSESSMENT & PLAN:  ?Thrombocytosis ?She had extensive evaluation in the past for this ?Sedimentation rate is within normal limits ?Next generation sequencing panel did not reveal any abnormalities  ?I suspect her thrombocytosis is reactive in nature; with improvement of anemia and iron stores, it is almost completely back to normal ?The patient also have inflammatory arthritis requiring prednisone treatment intermittently ?Recommend close observation only ?She does not need long-term follow-up ?The patient is reassured ? ?Iron deficiency ?Repeat iron studies are adequate ?She does not need long-term oral iron supplement ? ?No orders of the defined types were placed in this encounter. ? ? ?The total time spent in the appointment was 20 minutes encounter with patients including review of chart and various tests results, discussions about plan of care and coordination of care plan ? ? All questions were answered. The patient knows to call the clinic with any problems, questions or concerns. No barriers to learning was detected. ? ? ? Heath Lark, MD ?4/6/20234:18 PM ? ?INTERVAL HISTORY: ?Andrea Mcclure 61 y.o. female returns for follow-up with her husband ?I have not seen her for some time ?She had reactive thrombocytosis, being watched ?Since her last time I saw her, she have lost over 30 pounds ?The cause of her weight loss is due to less stress after she quit her job ?She continues to have intermittent joint pain, stable ? ?SUMMARY OF HEMATOLOGIC HISTORY: ? ?She was found to have abnormal CBC from blood work monitoring by her primary care doctor ?I have the opportunity to review skin records ?On 10/20/2017, her platelet count was 460,000 ?On March 09, 2018, platelet count was 469,000 ?On May 23, 2020, her platelet count was 606,000 with normal CBC and hemoglobin ?She is noted to have history of pleomorphic adenoma status post  resection and a thyroid nodule being monitored by ENT service ?She underwent extensive work-up in 2021.  Overall, her CBC is most consistent with iron deficiency anemia with reactive thrombocytosis ? ? ?I have reviewed the past medical history, past surgical history, social history and family history with the patient and they are unchanged from previous note. ? ?ALLERGIES:  is allergic to gabapentin, levaquin [levofloxacin], metformin and related, tizanidine, venlafaxine, wellbutrin [bupropion], and biofreeze [menthol (topical analgesic)]. ? ?MEDICATIONS:  ?Current Outpatient Medications  ?Medication Sig Dispense Refill  ? losartan (COZAAR) 50 MG tablet Take 25 mg by mouth daily.    ? oxyCODONE-acetaminophen (PERCOCET) 7.5-325 MG tablet Take by mouth.    ? aspirin 325 MG tablet Take 325 mg by mouth daily.    ? Cholecalciferol (VITAMIN D) 2000 units CAPS Take 1 capsule by mouth once a week.    ? FLUoxetine (PROZAC) 40 MG capsule Take 40 mg by mouth daily.    ? oxyCODONE-acetaminophen (PERCOCET/ROXICET) 5-325 MG tablet Take 1-2 tablets by mouth every 4 (four) hours as needed for severe pain.    ? ?No current facility-administered medications for this visit.  ?  ? ?REVIEW OF SYSTEMS:   ?Constitutional: Denies fevers, chills or night sweats ?Eyes: Denies blurriness of vision ?Ears, nose, mouth, throat, and face: Denies mucositis or sore throat ?Respiratory: Denies cough, dyspnea or wheezes ?Cardiovascular: Denies palpitation, chest discomfort or lower extremity swelling ?Gastrointestinal:  Denies nausea, heartburn or change in bowel habits ?Skin: Denies abnormal skin rashes ?Lymphatics: Denies new lymphadenopathy or easy bruising ?Neurological:Denies numbness, tingling or new weaknesses ?Behavioral/Psych: Mood is stable, no new changes  ?All  other systems were reviewed with the patient and are negative. ? ?PHYSICAL EXAMINATION: ?ECOG PERFORMANCE STATUS: 0 - Asymptomatic ? ?Vitals:  ? 11/05/21 1147  ?BP: 128/80  ?Pulse:  (!) 104  ?Resp: 18  ?Temp: 98.8 ?F (37.1 ?C)  ?SpO2: 100%  ? ?Filed Weights  ? 11/05/21 1147  ?Weight: 199 lb 14.4 oz (90.7 kg)  ? ? ?GENERAL:alert, no distress and comfortable ?NEURO: alert & oriented x 3 with fluent speech, no focal motor/sensory deficits ? ?LABORATORY DATA:  ?I have reviewed the data as listed ? ?   ?Component Value Date/Time  ? NA 136 07/24/2016 1748  ? K 4.9 07/24/2016 1748  ? CL 103 07/24/2016 1748  ? CO2 23 07/24/2016 1748  ? GLUCOSE 144 (H) 07/24/2016 1748  ? BUN 20 07/24/2016 1748  ? CREATININE 1.07 (H) 07/24/2016 1748  ? CALCIUM 9.3 07/24/2016 1748  ? GFRNONAA 58 (L) 07/24/2016 1748  ? GFRAA >60 07/24/2016 1748  ? ? ?No results found for: SPEP, UPEP ? ?Lab Results  ?Component Value Date  ? WBC 8.0 11/05/2021  ? NEUTROABS 5.4 11/05/2021  ? HGB 12.1 11/05/2021  ? HCT 36.5 11/05/2021  ? MCV 87.7 11/05/2021  ? PLT 425 (H) 11/05/2021  ? ? ?  Chemistry   ?   ?Component Value Date/Time  ? NA 136 07/24/2016 1748  ? K 4.9 07/24/2016 1748  ? CL 103 07/24/2016 1748  ? CO2 23 07/24/2016 1748  ? BUN 20 07/24/2016 1748  ? CREATININE 1.07 (H) 07/24/2016 1748  ?    ?Component Value Date/Time  ? CALCIUM 9.3 07/24/2016 1748  ?  ? ?

## 2021-11-05 NOTE — Telephone Encounter (Signed)
Returned her call. She is asking if she needs to be fasting for labs today. Told her that she does not need to be fasting. She verbalized understanding. ?

## 2021-11-05 NOTE — Telephone Encounter (Signed)
-----   Message from Heath Lark, MD sent at 11/05/2021  1:26 PM EDT ----- ?Pls call her, iron studies are ok ? ?

## 2021-11-05 NOTE — Telephone Encounter (Signed)
Called and left below message. Ask her to call the office back for questions. 

## 2022-01-08 ENCOUNTER — Telehealth: Payer: Self-pay | Admitting: Hematology and Oncology

## 2022-01-08 NOTE — Telephone Encounter (Signed)
.  Called patient to schedule appointment per 6/9 inbasket, patient is aware of date and time.   

## 2022-02-04 ENCOUNTER — Inpatient Hospital Stay: Payer: Managed Care, Other (non HMO) | Attending: Hematology and Oncology | Admitting: Hematology and Oncology

## 2022-02-04 ENCOUNTER — Inpatient Hospital Stay: Payer: Managed Care, Other (non HMO)

## 2023-01-07 ENCOUNTER — Other Ambulatory Visit: Payer: Self-pay

## 2023-01-07 ENCOUNTER — Emergency Department (HOSPITAL_BASED_OUTPATIENT_CLINIC_OR_DEPARTMENT_OTHER): Payer: BLUE CROSS/BLUE SHIELD

## 2023-01-07 ENCOUNTER — Inpatient Hospital Stay (HOSPITAL_BASED_OUTPATIENT_CLINIC_OR_DEPARTMENT_OTHER)
Admission: EM | Admit: 2023-01-07 | Discharge: 2023-01-11 | DRG: 394 | Disposition: A | Discharge status: Home / Self Care | Payer: BLUE CROSS/BLUE SHIELD | Attending: Internal Medicine | Admitting: Internal Medicine

## 2023-01-07 DIAGNOSIS — I10 Essential (primary) hypertension: Secondary | ICD-10-CM | POA: Diagnosis present

## 2023-01-07 DIAGNOSIS — R6510 Systemic inflammatory response syndrome (SIRS) of non-infectious origin without acute organ dysfunction: Secondary | ICD-10-CM | POA: Diagnosis not present

## 2023-01-07 DIAGNOSIS — G894 Chronic pain syndrome: Secondary | ICD-10-CM | POA: Diagnosis present

## 2023-01-07 DIAGNOSIS — Z79891 Long term (current) use of opiate analgesic: Secondary | ICD-10-CM

## 2023-01-07 DIAGNOSIS — F339 Major depressive disorder, recurrent, unspecified: Secondary | ICD-10-CM | POA: Diagnosis present

## 2023-01-07 DIAGNOSIS — Z8049 Family history of malignant neoplasm of other genital organs: Secondary | ICD-10-CM

## 2023-01-07 DIAGNOSIS — Z79899 Other long term (current) drug therapy: Secondary | ICD-10-CM

## 2023-01-07 DIAGNOSIS — K5900 Constipation, unspecified: Secondary | ICD-10-CM | POA: Diagnosis not present

## 2023-01-07 DIAGNOSIS — K388 Other specified diseases of appendix: Secondary | ICD-10-CM | POA: Diagnosis not present

## 2023-01-07 DIAGNOSIS — F32A Depression, unspecified: Secondary | ICD-10-CM | POA: Diagnosis present

## 2023-01-07 DIAGNOSIS — K5730 Diverticulosis of large intestine without perforation or abscess without bleeding: Secondary | ICD-10-CM | POA: Diagnosis present

## 2023-01-07 DIAGNOSIS — E278 Other specified disorders of adrenal gland: Secondary | ICD-10-CM | POA: Diagnosis present

## 2023-01-07 DIAGNOSIS — K648 Other hemorrhoids: Secondary | ICD-10-CM | POA: Diagnosis present

## 2023-01-07 DIAGNOSIS — E876 Hypokalemia: Secondary | ICD-10-CM | POA: Diagnosis present

## 2023-01-07 DIAGNOSIS — R1084 Generalized abdominal pain: Secondary | ICD-10-CM

## 2023-01-07 DIAGNOSIS — Z7982 Long term (current) use of aspirin: Secondary | ICD-10-CM

## 2023-01-07 DIAGNOSIS — E785 Hyperlipidemia, unspecified: Secondary | ICD-10-CM | POA: Diagnosis present

## 2023-01-07 DIAGNOSIS — R109 Unspecified abdominal pain: Secondary | ICD-10-CM | POA: Diagnosis present

## 2023-01-07 DIAGNOSIS — K5903 Drug induced constipation: Secondary | ICD-10-CM | POA: Diagnosis present

## 2023-01-07 DIAGNOSIS — K838 Other specified diseases of biliary tract: Secondary | ICD-10-CM | POA: Diagnosis present

## 2023-01-07 DIAGNOSIS — K644 Residual hemorrhoidal skin tags: Secondary | ICD-10-CM | POA: Diagnosis present

## 2023-01-07 DIAGNOSIS — Z888 Allergy status to other drugs, medicaments and biological substances status: Secondary | ICD-10-CM

## 2023-01-07 DIAGNOSIS — T402X5A Adverse effect of other opioids, initial encounter: Secondary | ICD-10-CM | POA: Diagnosis present

## 2023-01-07 LAB — CBC WITH DIFFERENTIAL/PLATELET
Abs Immature Granulocytes: 0.11 10*3/uL — ABNORMAL HIGH (ref 0.00–0.07)
Basophils Absolute: 0 10*3/uL (ref 0.0–0.1)
Basophils Relative: 0 %
Eosinophils Absolute: 0 10*3/uL (ref 0.0–0.5)
Eosinophils Relative: 0 %
HCT: 41.9 % (ref 36.0–46.0)
Hemoglobin: 14.2 g/dL (ref 12.0–15.0)
Immature Granulocytes: 1 %
Lymphocytes Relative: 5 %
Lymphs Abs: 1.1 10*3/uL (ref 0.7–4.0)
MCH: 28.7 pg (ref 26.0–34.0)
MCHC: 33.9 g/dL (ref 30.0–36.0)
MCV: 84.6 fL (ref 80.0–100.0)
Monocytes Absolute: 0.4 10*3/uL (ref 0.1–1.0)
Monocytes Relative: 2 %
Neutro Abs: 22.4 10*3/uL — ABNORMAL HIGH (ref 1.7–7.7)
Neutrophils Relative %: 92 %
Platelets: 627 10*3/uL — ABNORMAL HIGH (ref 150–400)
RBC: 4.95 MIL/uL (ref 3.87–5.11)
RDW: 13.6 % (ref 11.5–15.5)
WBC: 24 10*3/uL — ABNORMAL HIGH (ref 4.0–10.5)
nRBC: 0 % (ref 0.0–0.2)

## 2023-01-07 LAB — URINALYSIS, ROUTINE W REFLEX MICROSCOPIC
Bacteria, UA: NONE SEEN
Glucose, UA: NEGATIVE mg/dL
Ketones, ur: 15 mg/dL — AB
Leukocytes,Ua: NEGATIVE
Nitrite: NEGATIVE
Protein, ur: 30 mg/dL — AB
RBC / HPF: >50 RBC/hpf (ref 0–5)
Specific Gravity, Urine: 1.04 — ABNORMAL HIGH (ref 1.005–1.030)
pH: 6 (ref 5.0–8.0)

## 2023-01-07 LAB — COMPREHENSIVE METABOLIC PANEL
ALT: 22 U/L (ref 0–44)
AST: 27 U/L (ref 15–41)
Albumin: 3.9 g/dL (ref 3.5–5.0)
Alkaline Phosphatase: 84 U/L (ref 38–126)
Anion gap: 13 (ref 5–15)
BUN: 15 mg/dL (ref 8–23)
CO2: 23 mmol/L (ref 22–32)
Calcium: 8.4 mg/dL — ABNORMAL LOW (ref 8.9–10.3)
Chloride: 103 mmol/L (ref 98–111)
Creatinine, Ser: 0.74 mg/dL (ref 0.44–1.00)
GFR, Estimated: >60 mL/min (ref >60)
Glucose, Bld: 167 mg/dL — ABNORMAL HIGH (ref 70–99)
Potassium: 3.7 mmol/L (ref 3.5–5.1)
Sodium: 139 mmol/L (ref 135–145)
Total Bilirubin: 1.2 mg/dL (ref 0.3–1.2)
Total Protein: 7 g/dL (ref 6.5–8.1)

## 2023-01-07 LAB — LACTIC ACID, PLASMA
Lactic Acid, Venous: 2.9 mmol/L (ref 0.5–1.9)
Lactic Acid, Venous: 2.6 mmol/L (ref 0.5–1.9)

## 2023-01-07 LAB — LIPASE, BLOOD: Lipase: <10 U/L — ABNORMAL LOW (ref 11–51)

## 2023-01-07 MED ORDER — HYDROMORPHONE HCL 1 MG/ML IJ SOLN
1.0000 mg | Freq: Once | INTRAMUSCULAR | Status: AC
  Administered 2023-01-07: 1 mg via INTRAVENOUS
  Filled 2023-01-07: qty 1

## 2023-01-07 MED ORDER — ONDANSETRON HCL 4 MG/2ML IJ SOLN
4.0000 mg | Freq: Once | INTRAMUSCULAR | Status: AC
  Administered 2023-01-07: 4 mg via INTRAVENOUS
  Filled 2023-01-07: qty 2

## 2023-01-07 MED ORDER — IOHEXOL 300 MG/ML  SOLN
100.0000 mL | Freq: Once | INTRAMUSCULAR | Status: AC | PRN
  Administered 2023-01-07: 80 mL via INTRAVENOUS

## 2023-01-07 MED ORDER — HYDROMORPHONE HCL 1 MG/ML IJ SOLN
1.0000 mg | INTRAMUSCULAR | Status: DC | PRN
  Administered 2023-01-07 – 2023-01-08 (×2): 1 mg via INTRAVENOUS
  Filled 2023-01-07 (×2): qty 1

## 2023-01-07 MED ORDER — LACTATED RINGERS IV BOLUS
1000.0000 mL | Freq: Once | INTRAVENOUS | Status: AC
  Administered 2023-01-07: 1000 mL via INTRAVENOUS

## 2023-01-07 MED ORDER — PIPERACILLIN-TAZOBACTAM 3.375 G IVPB
3.3750 g | Freq: Once | INTRAVENOUS | Status: AC
  Administered 2023-01-07: 3.375 g via INTRAVENOUS
  Filled 2023-01-07: qty 50

## 2023-01-07 MED ORDER — SODIUM CHLORIDE 0.9 % IV BOLUS
1000.0000 mL | Freq: Once | INTRAVENOUS | Status: AC
  Administered 2023-01-07: 1000 mL via INTRAVENOUS

## 2023-01-07 NOTE — ED Triage Notes (Signed)
Pt arrives pov, to triage in wheelchair with c/o lower ABD pain with n/v since early morning. Was referred by UC r/t elev WBC count of 24.3

## 2023-01-07 NOTE — ED Notes (Signed)
Report called to Wendy, RN

## 2023-01-07 NOTE — ED Provider Notes (Signed)
[place] EMERGENCY DEPARTMENT AT [facility] Provider Note   CSN: [id number] Arrival date & time: 01/07/23  1707     History  Chief Complaint  Patient presents with   Abdominal Pain    Andrea Mcclure is a 62 y.o. female.  Patient presents to the emergency department for evaluation of abdominal pain, nausea, vomiting starting early this morning.  Patient has had a metallic taste in her mouth for several days but has been otherwise doing well.  She has a history of cholecystectomy.  She still has an appendix.  Pain has been severe at times, worse with movement across the lower abdomen bilaterally.  It favors the right side slightly.  No fevers.  No blood in the stool.  She has had very dark urine and has not been able to drink very much fluid today.  She has chronic pain and takes chronic Percocet.  She was able to tolerate 1 dose of this today.  Labs sent by PCP demonstrated normal kidney function, normal transaminases, white blood cell count 24,000.       Home Medications Prior to Admission medications   Medication Sig Start Date End Date Taking? Authorizing Provider  aspirin 325 MG tablet Take 325 mg by mouth daily.    [provider]  Cholecalciferol (VITAMIN D) 2000 units CAPS Take 1 capsule by mouth once a week.    [provider]  FLUoxetine (PROZAC) 40 MG capsule Take 40 mg by mouth daily.    [provider]  losartan (COZAAR) 50 MG tablet Take 25 mg by mouth daily.    [provider]  oxyCODONE-acetaminophen (PERCOCET) 7.5-325 MG tablet Take by mouth. 10/28/21   [provider]  oxyCODONE-acetaminophen (PERCOCET/ROXICET) 5-325 MG tablet Take 1-2 tablets by mouth every 4 (four) hours as needed for severe pain.    [provider]      Allergies    Gabapentin, Levaquin [levofloxacin], Metformin and related, Tizanidine, Venlafaxine, Wellbutrin [bupropion], and Biofreeze [menthol (topical analgesic)]     Review of Systems   Review of Systems  Physical Exam Updated Vital Signs BP (!) 155/80   Pulse (!) 113   Temp 98.7 F (37.1 C) (Oral)   Resp 15   Ht 5\' 7"  (1.702 m)   Wt 97.5 kg   LMP 07/24/2012   SpO2 96%   BMI 33.67 kg/m  Physical Exam Vitals and nursing note reviewed.  Constitutional:      General: She is not in acute distress.    Appearance: She is well-developed.  HENT:     Head: Normocephalic and atraumatic.     Right Ear: External ear normal.     Left Ear: External ear normal.     Nose: Nose normal.  Eyes:     Conjunctiva/sclera: Conjunctivae normal.  Cardiovascular:     Rate and Rhythm: Regular rhythm. Tachycardia present.     Heart sounds: No murmur heard. Pulmonary:     Effort: No respiratory distress.     Breath sounds: No wheezing, rhonchi or rales.  Abdominal:     Palpations: Abdomen is soft.     Tenderness: There is abdominal tenderness in the right lower quadrant, suprapubic area and left lower quadrant. There is guarding. There is no rebound. Negative signs include Murphy's sign and McBurney's sign.  Musculoskeletal:     Cervical back: Normal range of motion and neck supple.     Right lower leg: No edema.     Left lower leg: No edema.  Skin:    General: Skin is warm and dry.     Findings: No rash.  Neurological:     General: No focal deficit present.     Mental Status: She is alert. Mental status is at baseline.     Motor: No weakness.  Psychiatric:        Mood and Affect: Mood normal.     ED Results / Procedures / Treatments   Labs (all labs ordered are listed, but only abnormal results are displayed) Labs Reviewed  LACTIC ACID, PLASMA - Abnormal; Notable for the following components:      Result Value   Lactic Acid, Venous 2.9 (*)    All other components within normal limits  LIPASE, BLOOD - Abnormal; Notable for the following components:   Lipase <10 (*)    All other components within normal limits  URINALYSIS, ROUTINE W REFLEX  MICROSCOPIC - Abnormal; Notable for the following components:   Specific Gravity, Urine 1.040 (*)    Hgb urine dipstick MODERATE (*)    Bilirubin Urine SMALL (*)    Ketones, ur 15 (*)    Protein, ur 30 (*)    All other components within normal limits  LACTIC ACID, PLASMA               EKG EKG Interpretation  Date/Time:  Friday January 07 2023 17:32:19 EDT Ventricular Rate:  110 PR Interval:  179 QRS Duration: 89 QT Interval:  342 QTC Calculation: 463 R Axis:   19 Text Interpretation: Sinus tachycardia Low voltage, precordial leads Consider anterior infarct Minimal ST elevation, inferior leads rate increase from prior no stemi Confirmed by Tanda Rockers ([id number]) on 01/07/2023 5:36:40 PM  Radiology CT ABDOMEN PELVIS W CONTRAST  Result Date: 01/07/2023 CLINICAL DATA:  Acute abdominal pain. Nausea and vomiting. EXAM: CT ABDOMEN AND PELVIS WITH CONTRAST TECHNIQUE: Multidetector CT imaging of the abdomen and pelvis was performed using the standard protocol following bolus administration of intravenous contrast. RADIATION DOSE REDUCTION: This exam was performed according to the departmental dose-optimization program which includes automated exposure control, adjustment of the mA and/or kV according to patient size and/or use of iterative reconstruction technique. CONTRAST:  80mL OMNIPAQUE IOHEXOL 300 MG/ML  SOLN COMPARISON:  None Available. FINDINGS: Lower chest: Subsegmental atelectasis in the right lower lobe. No pleural fluid. Hepatobiliary: Mild diffuse hepatic steatosis. No focal liver lesion. Cholecystectomy. There is intra and extrahepatic biliary ductal dilatation. The common bile duct measures 16 mm. Normal tapering to the duodenal insertion. Pancreas: No ductal dilatation or inflammation. Spleen: Normal in size without focal abnormality. Adrenals/Urinary Tract: 3.9 cm left adrenal nodule with Hounsfield units of 62. Normal right adrenal gland. No hydronephrosis or perinephric edema.  Homogeneous renal enhancement with symmetric excretion on delayed phase imaging. No focal renal abnormality or stone. Urinary bladder is completely empty and not well assessed. Stomach/Bowel: Minimal hiatal hernia. There is a small posterior gastric diverticulum. No small bowel obstruction or inflammation. The appendix is normal caliber, however there is flaring of the distal appendix with a 11 mm fluid density structure at the appendiceal tip, best appreciated on series 6, image 33. No peri appendiceal fat stranding. Large volume of stool in the colon with colonic redundancy. There is a transition from stool-filled to non stool-filled colon in the sigmoid, series 2, image 76. The more distal colon is decompressed. There is no obvious obstructing colonic mass. Multiple colonic diverticula but no focally inflamed diverticulum or evidence of diverticulitis. Vascular/Lymphatic: Aortic atherosclerosis without aneurysm. The  portal vein is patent. No abdominopelvic adenopathy, including no pericolonic adenopathy. Reproductive: Uterus and bilateral adnexa are unremarkable. Other: Trace scattered free fluid in the pelvis. Free air or focal fluid collection. There is a small fat containing umbilical hernia. Minimal fat in both inguinal canals. 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 Diffuse facet hypertrophy in the lumbar spine. There are no acute or suspicious osseous abnormalities. IMPRESSION: 1. Large volume of stool in the colon with colonic redundancy, suggesting constipation. There is a transition from stool-filled to non stool-filled colon in the sigmoid, without obvious obstructing colonic mass or inflammation. Recommend correlation with colonoscopy to exclude CT occult colonic mass. 2. Normal caliber appendix, however there is a 11 mm fluid density structure at the appendiceal tip, which may represent a mucocele. No peri appendiceal fat stranding. Recommend elective surgical consultation. 3. Intra and  extrahepatic biliary ductal dilatation postcholecystectomy. Recommend correlation with LFTs. If elevated, recommend further evaluation with MRCP. 4. Colonic diverticulosis without diverticulitis. 5. Mild hepatic steatosis. 6. Indeterminate 3.9 cm left adrenal nodule. Recommend adrenal washout CT or chemical shift MRI. JACR 2017 Aug; 14(8):1038-44, JCAT 2016 Mar-Apr; 40(2):194-200, Urol J 2006 Spring; 3(2):71-4. Aortic Atherosclerosis (ICD10-I70.0). Electronically Signed   By: Narda Rutherford M.D.   On: 01/07/2023 18:49    Procedures Procedures    Medications Ordered in ED Medications  piperacillin-tazobactam (ZOSYN) IVPB 3.375 g (has no administration in time range)  sodium chloride 0.9 % bolus 1,000 mL (1,000 mLs Intravenous New Bag/Given 01/07/23 1801)  HYDROmorphone (DILAUDID) injection 1 mg (1 mg Intravenous Given 01/07/23 1806)  ondansetron (ZOFRAN) injection 4 mg (4 mg Intravenous Given 01/07/23 1806)  iohexol (OMNIPAQUE) 300 MG/ML solution 100 mL (80 mLs Intravenous Contrast Given 01/07/23 1819)  lactated ringers bolus 1,000 mL (1,000 mLs Intravenous New Bag/Given 01/07/23 1854)    ED Course/ Medical Decision Making/ A&P    Patient seen and examined. History obtained directly from patient.  Lab workup performed earlier today documented in media tab.  Labs/EKG: Lactate, lipase  Imaging: Ordered CT abdomen pelvis.  Medications/Fluids: Ordered: IV fluid bolus, IV Dilaudid, IV Zofran  Most recent vital signs reviewed and are as follows: BP (!) 155/80   Pulse (!) 113   Temp 98.7 F (37.1 C) (Oral)   Resp 15   Ht 5\' 7"  (1.702 m)   Wt 97.5 kg   LMP 07/24/2012   SpO2 96%   BMI 33.67 kg/m   Initial impression: Lower abdominal pain with concerning exam.  CT ordered to evaluate for infection, perforation.  7:12 PM Reassessment performed. Patient appears able.  She continues to be tachycardic.  Labs personally reviewed and interpreted including: Lactate elevated at 2.9, lipase  unremarkable; UA with hematuria, concentrated, calcium oxalate crystals.  Imaging personally visualized and interpreted including: CT of the abdomen and pelvis with constipation and possible edema of the descending colon.  Reviewed pertinent lab work and imaging with patient at bedside. Questions answered.   Most current vital signs reviewed and are as follows: BP (!) 155/80   Pulse (!) 113   Temp 98.7 F (37.1 C) (Oral)   Resp 15   Ht 5\' 7"  (1.702 m)   Wt 97.5 kg   LMP 07/24/2012   SpO2 96%   BMI 33.67 kg/m   Plan: Admit to the hospital.  Patient meets SIRS criteria with a concerning abdominal exam.  Her labs are concerning with leukocytosis and elevated lactate.  Her CT scan is not definitive however based on exam will initiate treatment with  Zosyn and continue IV fluids.  I am concerned that if she did go home, she may continue to get worse.  Signout to [employer] at shift change who will admit patient to the hospital.                              Medical Decision Making Amount and/or Complexity of Data Reviewed Labs: ordered. Radiology: ordered.  Risk Prescription drug management. Decision regarding hospitalization.   SIRS criteria and abdominal pain with concerning exam, admit for continued evaluation.  No acute surgical needs identified on CT imaging.        Final Clinical Impression(s) / ED Diagnoses Final diagnoses:  SIRS (systemic inflammatory response syndrome) (HCC)  Generalized abdominal pain    Rx / DC Orders ED Discharge Orders     None         Renne Crigler, PA-C 01/07/23 1915    Melene Plan, DO 01/10/23 [phone]

## 2023-01-07 NOTE — ED Notes (Signed)
Date and time results received: 01/07/23 2006 (use smartphrase ".now" to insert current time)  Test: lactic acid Critical Value: 2.6  Name of Provider Notified: Gwenette Greet, DO  Orders Received? Or Actions Taken?:  n/a

## 2023-01-07 NOTE — ED Notes (Signed)
ED Provider at bedside. 

## 2023-01-08 ENCOUNTER — Encounter (HOSPITAL_COMMUNITY): Payer: Self-pay | Admitting: Internal Medicine

## 2023-01-08 DIAGNOSIS — R109 Unspecified abdominal pain: Secondary | ICD-10-CM | POA: Diagnosis not present

## 2023-01-08 DIAGNOSIS — T402X5A Adverse effect of other opioids, initial encounter: Secondary | ICD-10-CM | POA: Diagnosis not present

## 2023-01-08 DIAGNOSIS — Z8049 Family history of malignant neoplasm of other genital organs: Secondary | ICD-10-CM | POA: Diagnosis not present

## 2023-01-08 DIAGNOSIS — K5903 Drug induced constipation: Secondary | ICD-10-CM | POA: Diagnosis not present

## 2023-01-08 DIAGNOSIS — K838 Other specified diseases of biliary tract: Secondary | ICD-10-CM | POA: Diagnosis not present

## 2023-01-08 DIAGNOSIS — K388 Other specified diseases of appendix: Secondary | ICD-10-CM | POA: Diagnosis present

## 2023-01-08 DIAGNOSIS — E785 Hyperlipidemia, unspecified: Secondary | ICD-10-CM | POA: Diagnosis not present

## 2023-01-08 DIAGNOSIS — G894 Chronic pain syndrome: Secondary | ICD-10-CM | POA: Diagnosis not present

## 2023-01-08 DIAGNOSIS — E278 Other specified disorders of adrenal gland: Secondary | ICD-10-CM | POA: Diagnosis not present

## 2023-01-08 DIAGNOSIS — Z79891 Long term (current) use of opiate analgesic: Secondary | ICD-10-CM | POA: Diagnosis not present

## 2023-01-08 DIAGNOSIS — K5900 Constipation, unspecified: Secondary | ICD-10-CM | POA: Diagnosis present

## 2023-01-08 DIAGNOSIS — Z7982 Long term (current) use of aspirin: Secondary | ICD-10-CM | POA: Diagnosis not present

## 2023-01-08 DIAGNOSIS — K5730 Diverticulosis of large intestine without perforation or abscess without bleeding: Secondary | ICD-10-CM | POA: Diagnosis not present

## 2023-01-08 DIAGNOSIS — K648 Other hemorrhoids: Secondary | ICD-10-CM | POA: Diagnosis not present

## 2023-01-08 DIAGNOSIS — K644 Residual hemorrhoidal skin tags: Secondary | ICD-10-CM | POA: Diagnosis not present

## 2023-01-08 DIAGNOSIS — I10 Essential (primary) hypertension: Secondary | ICD-10-CM | POA: Diagnosis not present

## 2023-01-08 DIAGNOSIS — F32A Depression, unspecified: Secondary | ICD-10-CM | POA: Diagnosis not present

## 2023-01-08 DIAGNOSIS — E876 Hypokalemia: Secondary | ICD-10-CM | POA: Diagnosis not present

## 2023-01-08 DIAGNOSIS — R6510 Systemic inflammatory response syndrome (SIRS) of non-infectious origin without acute organ dysfunction: Secondary | ICD-10-CM | POA: Diagnosis not present

## 2023-01-08 DIAGNOSIS — Z79899 Other long term (current) drug therapy: Secondary | ICD-10-CM | POA: Diagnosis not present

## 2023-01-08 DIAGNOSIS — Z888 Allergy status to other drugs, medicaments and biological substances status: Secondary | ICD-10-CM | POA: Diagnosis not present

## 2023-01-08 LAB — CBC
HCT: 34.1 % — ABNORMAL LOW (ref 36.0–46.0)
Hemoglobin: 11.3 g/dL — ABNORMAL LOW (ref 12.0–15.0)
MCH: 27.9 pg (ref 26.0–34.0)
MCHC: 33.1 g/dL (ref 30.0–36.0)
MCV: 84.2 fL (ref 80.0–100.0)
Platelets: 444 10*3/uL — ABNORMAL HIGH (ref 150–400)
RBC: 4.05 MIL/uL (ref 3.87–5.11)
RDW: 13.7 % (ref 11.5–15.5)
WBC: 15.7 10*3/uL — ABNORMAL HIGH (ref 4.0–10.5)
nRBC: 0 % (ref 0.0–0.2)

## 2023-01-08 LAB — COMPREHENSIVE METABOLIC PANEL
ALT: 24 U/L (ref 0–44)
AST: 28 U/L (ref 15–41)
Albumin: 3 g/dL — ABNORMAL LOW (ref 3.5–5.0)
Alkaline Phosphatase: 71 U/L (ref 38–126)
Anion gap: 11 (ref 5–15)
BUN: 12 mg/dL (ref 8–23)
CO2: 24 mmol/L (ref 22–32)
Calcium: 8 mg/dL — ABNORMAL LOW (ref 8.9–10.3)
Chloride: 102 mmol/L (ref 98–111)
Creatinine, Ser: 0.88 mg/dL (ref 0.44–1.00)
GFR, Estimated: >60 mL/min (ref >60)
Glucose, Bld: 149 mg/dL — ABNORMAL HIGH (ref 70–99)
Potassium: 3.3 mmol/L — ABNORMAL LOW (ref 3.5–5.1)
Sodium: 137 mmol/L (ref 135–145)
Total Bilirubin: 1.3 mg/dL — ABNORMAL HIGH (ref 0.3–1.2)
Total Protein: 6.1 g/dL — ABNORMAL LOW (ref 6.5–8.1)

## 2023-01-08 LAB — HIV ANTIBODY (ROUTINE TESTING W REFLEX): HIV Screen 4th Generation wRfx: NONREACTIVE

## 2023-01-08 LAB — C-REACTIVE PROTEIN: CRP: 1.7 mg/dL — ABNORMAL HIGH (ref <1.0)

## 2023-01-08 LAB — LACTIC ACID, PLASMA: Lactic Acid, Venous: 2 mmol/L (ref 0.5–1.9)

## 2023-01-08 LAB — PROCALCITONIN: Procalcitonin: 0.32 ng/mL

## 2023-01-08 MED ORDER — ACETAMINOPHEN 325 MG PO TABS
650.0000 mg | ORAL_TABLET | Freq: Four times a day (QID) | ORAL | Status: DC | PRN
  Administered 2023-01-08 – 2023-01-11 (×2): 650 mg via ORAL
  Filled 2023-01-08 (×3): qty 2

## 2023-01-08 MED ORDER — BISACODYL 5 MG PO TBEC: 5.0000 mg | DELAYED_RELEASE_TABLET | Freq: Every day | ORAL | Status: DC | PRN

## 2023-01-08 MED ORDER — OXYCODONE HCL 5 MG PO TABS
5.0000 mg | ORAL_TABLET | ORAL | Status: DC | PRN
  Administered 2023-01-08 (×2): 5 mg via ORAL
  Filled 2023-01-08 (×3): qty 1

## 2023-01-08 MED ORDER — PEG 3350-KCL-NA BICARB-NACL 420 G PO SOLR
4000.0000 mL | Freq: Once | ORAL | Status: DC
  Filled 2023-01-08: qty 4000

## 2023-01-08 MED ORDER — ONDANSETRON HCL 4 MG/2ML IJ SOLN
4.0000 mg | Freq: Four times a day (QID) | INTRAMUSCULAR | Status: DC | PRN
  Administered 2023-01-08 – 2023-01-09 (×4): 4 mg via INTRAVENOUS
  Filled 2023-01-08 (×4): qty 2

## 2023-01-08 MED ORDER — ENOXAPARIN SODIUM 40 MG/0.4ML IJ SOSY
40.0000 mg | PREFILLED_SYRINGE | INTRAMUSCULAR | Status: DC
  Administered 2023-01-08 – 2023-01-10 (×3): 40 mg via SUBCUTANEOUS
  Filled 2023-01-08 (×3): qty 0.4

## 2023-01-08 MED ORDER — SODIUM CHLORIDE 0.9 % IV SOLN: INTRAVENOUS | Status: DC

## 2023-01-08 MED ORDER — HYDROMORPHONE HCL 1 MG/ML IJ SOLN
1.0000 mg | INTRAMUSCULAR | Status: DC | PRN
  Administered 2023-01-08 – 2023-01-10 (×6): 1 mg via INTRAVENOUS
  Filled 2023-01-08 (×6): qty 1

## 2023-01-08 MED ORDER — POLYETHYLENE GLYCOL 3350 17 G PO PACK: 17.0000 g | PACK | Freq: Every day | ORAL | Status: DC | PRN

## 2023-01-08 MED ORDER — POTASSIUM CHLORIDE CRYS ER 20 MEQ PO TBCR
40.0000 meq | EXTENDED_RELEASE_TABLET | Freq: Once | ORAL | Status: AC
  Administered 2023-01-08: 40 meq via ORAL
  Filled 2023-01-08: qty 2

## 2023-01-08 MED ORDER — POLYETHYLENE GLYCOL 3350 17 G PO PACK
17.0000 g | PACK | Freq: Two times a day (BID) | ORAL | Status: DC
  Administered 2023-01-08 – 2023-01-11 (×5): 17 g via ORAL
  Filled 2023-01-08 (×5): qty 1

## 2023-01-08 MED ORDER — POLYETHYLENE GLYCOL 3350 17 GM/SCOOP PO POWD
1.0000 | Freq: Once | ORAL | Status: AC
  Administered 2023-01-08: 255 g via ORAL
  Filled 2023-01-08: qty 255

## 2023-01-08 MED ORDER — MORPHINE SULFATE (PF) 2 MG/ML IV SOLN
2.0000 mg | INTRAVENOUS | Status: DC | PRN
  Administered 2023-01-08 – 2023-01-09 (×3): 2 mg via INTRAVENOUS
  Filled 2023-01-08 (×3): qty 1

## 2023-01-08 MED ORDER — SODIUM CHLORIDE 0.9% FLUSH
3.0000 mL | Freq: Two times a day (BID) | INTRAVENOUS | Status: DC
  Administered 2023-01-08 – 2023-01-10 (×5): 3 mL via INTRAVENOUS

## 2023-01-08 MED ORDER — DOCUSATE SODIUM 100 MG PO CAPS
100.0000 mg | ORAL_CAPSULE | Freq: Two times a day (BID) | ORAL | Status: DC
  Administered 2023-01-08: 100 mg via ORAL
  Filled 2023-01-08: qty 1

## 2023-01-08 MED ORDER — ACETAMINOPHEN 650 MG RE SUPP: 650.0000 mg | Freq: Four times a day (QID) | RECTAL | Status: DC | PRN

## 2023-01-08 MED ORDER — DOCUSATE SODIUM 100 MG PO CAPS
200.0000 mg | ORAL_CAPSULE | Freq: Two times a day (BID) | ORAL | Status: DC
  Administered 2023-01-09 – 2023-01-11 (×4): 200 mg via ORAL
  Filled 2023-01-08 (×4): qty 2

## 2023-01-08 MED ORDER — PIPERACILLIN-TAZOBACTAM 3.375 G IVPB
3.3750 g | Freq: Three times a day (TID) | INTRAVENOUS | Status: DC
  Administered 2023-01-08 – 2023-01-11 (×10): 3.375 g via INTRAVENOUS
  Filled 2023-01-08 (×10): qty 50

## 2023-01-08 MED ORDER — LACTULOSE 10 GM/15ML PO SOLN
20.0000 g | Freq: Three times a day (TID) | ORAL | Status: AC
  Administered 2023-01-08 (×2): 20 g via ORAL
  Filled 2023-01-08 (×2): qty 30

## 2023-01-08 NOTE — Progress Notes (Signed)
Pharmacy Antibiotic Note  Andrea Mcclure is a 62 y.o. female admitted on 01/07/2023 with intra-abdominal infection.  Pharmacy has been consulted for Zosyn dosing.  Plan: Zosyn 3.375g IV q8h (4 hour infusion).  Height: 5\' 7"  (170.2 cm) Weight: 97.5 kg (215 lb) IBW/kg (Calculated) : 61.6  Temp (24hrs), Avg:99.1 F (37.3 C), Min:98.7 F (37.1 C), Max:99.9 F (37.7 C)  Recent Labs  Lab 01/07/23 1801 01/07/23 1806 01/07/23 1935 01/07/23 2019  WBC  --  24.0*  --   --   CREATININE  --   --   --  0.74  LATICACIDVEN 2.9*  --  2.6*  --     Estimated Creatinine Clearance: 88.6 mL/min (by C-G formula based on SCr of 0.74 mg/dL).    Allergies  Allergen Reactions   Gabapentin     unknown   Levaquin [Levofloxacin] Other (See Comments)   Metformin And Related     unknown   Tizanidine     unknown   Venlafaxine     unknown   Wellbutrin [Bupropion]     suicudal thoughts   Biofreeze [Menthol (Topical Analgesic)] Rash    Thank you for allowing pharmacy to be a part of this patient's care.  Vernard Gambles, PharmD, BCPS  01/08/2023 3:39 AM

## 2023-01-08 NOTE — Evaluation (Signed)
Occupational Therapy Evaluation and Discharge Patient Details Name: Andrea Mcclure MRN: [id number] DOB: 10/28/1960 Today's Date: 01/08/2023   History of Present Illness 62 y/o F admitted to [facility] on 6/7 for abdominal pain, nausea, and vomiting. CT of abdomen showing nonspecific colonic changes, large stool burden, and question for possible colonic mass. PMHx: HTN, depression, chrnoic pain   Clinical Impression   At baseline, pt is Independent with ADLs, IADLs, functional mobility/transfers without an AD, and drives. Pt currently present near baseline and demonstrates ability to complete all ADLs and functional mobility/transfers without an AD Independent to Mod I. Pt reports no concerns with ADLs, IADLs, or functional mobility upon discharge. Pt has no further benefit from acute skilled OT at this time, recommend return home upon discharge with no OT follow up indicated at this time. Acute OT will sign off.      Recommendations for follow up therapy are one component of a multi-disciplinary discharge planning process, led by the attending physician.  Recommendations may be updated based on patient status, additional functional criteria and insurance authorization.   Assistance Recommended at Discharge Other (comment) (PRN for home management tasks secondary to pain)  Patient can return home with the following      Functional Status Assessment  Patient has not had a recent decline in their functional status  Equipment Recommendations  None recommended by OT    Recommendations for Other Services       Precautions / Restrictions Precautions Precautions: Fall Restrictions Weight Bearing Restrictions: No      Mobility Bed Mobility Overal bed mobility: Modified Independent             General bed mobility comments: HOB elevated    Transfers Overall transfer level: Independent Equipment used: None                      Balance Overall balance assessment: No  apparent balance deficits (not formally assessed)                                         ADL either performed or assessed with clinical judgement   ADL Overall ADL's : Modified independent;Independent                                       General ADL Comments: Pt near baseline demonstrating ability to complete all ADLs Independent to Mod I.     Vision Baseline Vision/History: 1 Wears glasses (One pair for distance, one pair for reading) Ability to See in Adequate Light: 0 Adequate Patient Visual Report: Other (comment) (Pt reports recent dry eyes and reports taking medication for this)       Perception     Praxis Praxis Praxis tested?: Within functional limits    Pertinent Vitals/Pain Pain Assessment Pain Assessment: 0-10 Pain Score: 8  (Pain level 4-6 is normal for pt) Pain Location: Neck and back and headache Pain Descriptors / Indicators: Grimacing, Guarding (Raw) Pain Intervention(s): Limited activity within patient's tolerance, Monitored during session, Repositioned     Hand Dominance Right   Extremity/Trunk Assessment Upper Extremity Assessment Upper Extremity Assessment: Overall WFL for tasks assessed   Lower Extremity Assessment Lower Extremity Assessment: Defer to PT evaluation   Cervical / Trunk Assessment Cervical / Trunk Assessment: Normal   Communication Communication  Communication: No difficulties   Cognition Arousal/Alertness: Awake/alert Behavior During Therapy: WFL for tasks assessed/performed Overall Cognitive Status: No family/caregiver present to determine baseline cognitive functioning                                 General Comments: AAOx4. Cognition WFL for tasks assessed. Pt reports feeling more easily distracted since medication changes in October 2023.     General Comments  VSS on RA throughout session.    Exercises     Shoulder Instructions      Home Living Family/patient expects  to be discharged to:: Private residence Living Arrangements: Spouse/significant other Available Help at Discharge: Family;Available 24 hours/day Type of Home: House Home Access: Stairs to enter [employer] of Steps: 1 threshold Entrance Stairs-Rails: None Home Layout: One level     Bathroom Shower/Tub: Walk-in shower;Tub only   Bathroom Toilet: Standard Bathroom Accessibility: Yes   Home Equipment: Shower seat;Hand held shower head   Additional Comments: has a path around her house that is a quarter mile that she walks, walked half a mile the day before admission      Prior Functioning/Environment Prior Level of Function : Independent/Modified Independent;Driving             Mobility Comments: independent with all mobility, near fall when dog tripped her but able to catch herself ADLs Comments: independent with ADLs, IADLs, and drives        OT Problem List:        OT Treatment/Interventions:      OT Goals(Current goals can be found in the care plan section) Acute Rehab OT Goals Patient Stated Goal: to return home  OT Frequency:      Co-evaluation              AM-PAC OT "6 Clicks" Daily Activity     Outcome Measure Help from another person eating meals?: None Help from another person taking care of personal grooming?: None Help from another person toileting, which includes using toliet, bedpan, or urinal?: None Help from another person bathing (including washing, rinsing, drying)?: None Help from another person to put on and taking off regular upper body clothing?: None Help from another person to put on and taking off regular lower body clothing?: None 6 Click Score: 24   End of Session Nurse Communication: Mobility status  Activity Tolerance: Patient tolerated treatment well Patient left: in bed;with call bell/phone within reach                   Time: 1191-4782 OT Time Calculation (min): 25 min Charges:  OT General Charges $OT Visit:  1 Visit OT Evaluation $OT Eval Low Complexity: 1 Low  Janvi Ammar "Orson Eva., OTR/L, MA Acute Rehab [phone]   Lendon Colonel 01/08/2023, 6:32 PM

## 2023-01-08 NOTE — Progress Notes (Signed)
PROGRESS NOTE                                                                                                                                                                                                             Patient Demographics:    Andrea Mcclure, is a 62 y.o. female, DOB - 12/13/1960, ZOX:[id number]  Outpatient Primary MD for the patient is Andrea Mcclure, Andrea Cowden, MD    LOS - 0  Admit date - 01/07/2023    Chief Complaint  Patient presents with   Abdominal Pain       Brief Narrative (HPI from H&P)    62 y.o. female with medical history significant for hypertension, depression, and chronic pain who presents emergency department with abdominal pain, nausea, and vomiting.  Patient has history of chronic pain and is on narcotics and chronically constipated, workup in the hospital including CT abdomen pelvis suggestive of nonspecific colonic changes, large stool burden, question for possible colonic mass.   Subjective:    Andrea Mcclure today has, No headache, No chest pain, positive constipation and abdominal pain more so in the left lower quadrant- No Nausea, No new weakness tingling or numbness, no SOB   Assessment  & Plan :    Abdominal pain, nausea vomiting, questionable appendiceal lesion with question of occult colonic mass on CT scan. most of her GI issues appear to be stemming from narcotic bowel and large stool burden, placed on bowel regimen, for now cover with Zosyn, bowel rest, GI to evaluate.  Have requested her to titrate her narcotic regimen down.  Will await GI recommendations.   Chronic pain.  Minimize narcotic use.  Nonspecific CBD changes on CT scan.  No right upper quadrant pain or discomfort, stable LFTs.  Monitor  Left adrenal nodule.  Outpatient PCP directed workup.      Condition - Extremely Guarded  Family Communication  :  None  Code Status :  Full  Consults  :  GI  PUD Prophylaxis :     Procedures  :     CT - 1. Large volume of stool in the colon with colonic redundancy, suggesting constipation. There is a transition from stool-filled to non stool-filled colon in the sigmoid, without obvious obstructing colonic mass or inflammation. Recommend correlation with colonoscopy to exclude CT occult colonic mass. 2. Normal caliber appendix,  however there is a 11 mm fluid density structure at the appendiceal tip, which may represent a mucocele. No peri appendiceal fat stranding. Recommend elective surgical consultation. 3. Intra and extrahepatic biliary ductal dilatation postcholecystectomy. Recommend correlation with LFTs. If elevated, recommend further evaluation with MRCP. 4. Colonic diverticulosis without diverticulitis. 5. Mild hepatic steatosis. 6. Indeterminate 3.9 cm left adrenal nodule. Recommend adrenal washout CT or chemical shift MRI.       Disposition Plan  :    Status is: Observation   DVT Prophylaxis  :    enoxaparin (LOVENOX) injection 40 mg Start: 01/08/23 1400    Lab Results  Component Value Date   PLT 444 (H) 01/08/2023    Diet :  Diet Order             Diet clear liquid Room service appropriate? Yes; Fluid consistency: Thin  Diet effective now                    Inpatient Medications  Scheduled Meds:  docusate sodium  200 mg Oral BID   enoxaparin (LOVENOX) injection  40 mg Subcutaneous Q24H   lactulose  20 g Oral TID   polyethylene glycol  17 g Oral BID   sodium chloride flush  3 mL Intravenous Q12H   Continuous Infusions:  piperacillin-tazobactam (ZOSYN)  IV 3.375 g (01/08/23 0355)   PRN Meds:.acetaminophen **OR** acetaminophen, HYDROmorphone (DILAUDID) injection, ondansetron (ZOFRAN) IV, oxyCODONE  Antibiotics  :    Anti-infectives (From admission, onward)    Start     Dose/Rate Route Frequency Ordered Stop   01/08/23 0400  piperacillin-tazobactam (ZOSYN) IVPB 3.375 g        3.375 g 12.5 mL/hr over 240 Minutes Intravenous  Every 8 hours 01/08/23 0338     01/07/23 1900  piperacillin-tazobactam (ZOSYN) IVPB 3.375 g        3.375 g 12.5 mL/hr over 240 Minutes Intravenous  Once 01/07/23 1852 01/07/23 2007         Objective:   Vitals:   01/07/23 2300 01/08/23 0040 01/08/23 0350 01/08/23 0500  BP: 130/67 123/81 124/69   Pulse: 94 99 83   Resp: (!) 21 14 18    Temp:  99.9 F (37.7 C) 98.9 F (37.2 C)   TempSrc:  Oral Oral   SpO2: 91% 95% 94%   Weight:    97.4 kg  Height:        Wt Readings from Last 3 Encounters:  01/08/23 97.4 kg  11/05/21 90.7 kg  09/09/20 104.3 kg     Intake/Output Summary (Last 24 hours) at 01/08/2023 1045 Last data filed at 01/08/2023 0913 Gross per 24 hour  Intake 483 ml  Output 300 ml  Net 183 ml     Physical Exam  Awake Alert, No new F.N deficits, Normal affect [place].AT,PERRAL Supple Neck, No JVD,   Symmetrical Chest wall movement, Good air movement bilaterally, CTAB RRR,No Gallops,Rubs or new Murmurs,  +ve B.Sounds, Abd Soft, mild LLQ tenderness,   No Cyanosis, Clubbing or edema        Data Review:    Recent Labs  Lab 01/07/23 1806 01/08/23 0328  WBC 24.0* 15.7*  HGB 14.2 11.3*  HCT 41.9 34.1*  PLT 627* 444*  MCV 84.6 84.2  MCH 28.7 27.9  MCHC 33.9 33.1  RDW 13.6 13.7  LYMPHSABS 1.1  --   MONOABS 0.4  --   EOSABS 0.0  --   BASOSABS 0.0  --     Recent Labs  Lab 01/07/23 1801 01/07/23 1935 01/07/23 2019 01/08/23 0328  NA  --   --  139 137  K  --   --  3.7 3.3*  CL  --   --  103 102  CO2  --   --  23 24  ANIONGAP  --   --  13 11  GLUCOSE  --   --  167* 149*  BUN  --   --  15 12  CREATININE  --   --  0.74 0.88  AST  --   --  27 28  ALT  --   --  22 24  ALKPHOS  --   --  84 71  BILITOT  --   --  1.2 1.3*  ALBUMIN  --   --  3.9 3.0*  PROCALCITON  --   --   --  0.32  LATICACIDVEN 2.9* 2.6*  --  2.0*  CALCIUM  --   --  8.4* 8.0*      Recent Labs  Lab 01/07/23 1801 01/07/23 1935 01/07/23 2019 01/08/23 0328  PROCALCITON  --   --    --  0.32  LATICACIDVEN 2.9* 2.6*  --  2.0*  CALCIUM  --   --  8.4* 8.0*      Radiology Reports CT ABDOMEN PELVIS W CONTRAST  Result Date: 01/07/2023 CLINICAL DATA:  Acute abdominal pain. Nausea and vomiting. EXAM: CT ABDOMEN AND PELVIS WITH CONTRAST TECHNIQUE: Multidetector CT imaging of the abdomen and pelvis was performed using the standard protocol following bolus administration of intravenous contrast. RADIATION DOSE REDUCTION: This exam was performed according to the departmental dose-optimization program which includes automated exposure control, adjustment of the mA and/or kV according to patient size and/or use of iterative reconstruction technique. CONTRAST:  80mL OMNIPAQUE IOHEXOL 300 MG/ML  SOLN COMPARISON:  None Available. FINDINGS: Lower chest: Subsegmental atelectasis in the right lower lobe. No pleural fluid. Hepatobiliary: Mild diffuse hepatic steatosis. No focal liver lesion. Cholecystectomy. There is intra and extrahepatic biliary ductal dilatation. The common bile duct measures 16 mm. Normal tapering to the duodenal insertion. Pancreas: No ductal dilatation or inflammation. Spleen: Normal in size without focal abnormality. Adrenals/Urinary Tract: 3.9 cm left adrenal nodule with Hounsfield units of 62. Normal right adrenal gland. No hydronephrosis or perinephric edema. Homogeneous renal enhancement with symmetric excretion on delayed phase imaging. No focal renal abnormality or stone. Urinary bladder is completely empty and not well assessed. Stomach/Bowel: Minimal hiatal hernia. There is a small posterior gastric diverticulum. No small bowel obstruction or inflammation. The appendix is normal caliber, however there is flaring of the distal appendix with a 11 mm fluid density structure at the appendiceal tip, best appreciated on series 6, image 33. No peri appendiceal fat stranding. Large volume of stool in the colon with colonic redundancy. There is a transition from stool-filled to non  stool-filled colon in the sigmoid, series 2, image 76. The more distal colon is decompressed. There is no obvious obstructing colonic mass. Multiple colonic diverticula but no focally inflamed diverticulum or evidence of diverticulitis. Vascular/Lymphatic: Aortic atherosclerosis without aneurysm. The portal vein is patent. No abdominopelvic adenopathy, including no pericolonic adenopathy. Reproductive: Uterus and bilateral adnexa are unremarkable. Other: Trace scattered free fluid in the pelvis. Free air or focal fluid collection. There is a small fat containing umbilical hernia. Minimal fat in both inguinal canals. 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 Diffuse facet hypertrophy in the lumbar spine. There are no acute or suspicious osseous abnormalities. IMPRESSION:  1. Large volume of stool in the colon with colonic redundancy, suggesting constipation. There is a transition from stool-filled to non stool-filled colon in the sigmoid, without obvious obstructing colonic mass or inflammation. Recommend correlation with colonoscopy to exclude CT occult colonic mass. 2. Normal caliber appendix, however there is a 11 mm fluid density structure at the appendiceal tip, which may represent a mucocele. No peri appendiceal fat stranding. Recommend elective surgical consultation. 3. Intra and extrahepatic biliary ductal dilatation postcholecystectomy. Recommend correlation with LFTs. If elevated, recommend further evaluation with MRCP. 4. Colonic diverticulosis without diverticulitis. 5. Mild hepatic steatosis. 6. Indeterminate 3.9 cm left adrenal nodule. Recommend adrenal washout CT or chemical shift MRI. JACR 2017 Aug; 14(8):1038-44, JCAT 2016 Mar-Apr; 40(2):194-200, Urol J 2006 Spring; 3(2):71-4. Aortic Atherosclerosis (ICD10-I70.0). Electronically Signed   By: Narda Rutherford M.D.   On: 01/07/2023 18:49      Signature  -   Susa Raring M.D on 01/08/2023 at 10:45 AM   -  To page go to [email or web address]

## 2023-01-08 NOTE — Consult Note (Signed)
Referring Provider: Dr. Thedore Mins Primary Care Physician:  Melida Quitter, MD Primary Gastroenterologist:  Dr. Dulce Sellar  Reason for Consultation:  Abdominal pain; Abnormal CT  HPI: Andrea Mcclure is a 62 y.o. female with acute onset of LLQ abdominal pain yesterday early morning followed by profuse vomiting, nausea, sweats and chills later that morning. Has had a change in her bowel movements this month stating that her last normal BM was June 3 and then after that had very small amounts of stool since then. Denies rectal bleeding. CT showed large amount of stool in the colon until a transition point in the sigmoid colon where no stool was seen distal to this area. No definite mass seen on CT. No inflammation seen. Intrahepatic and extrahepatic biliary dilation noted. WBC 24K. Colonoscopy in July 2020 showed left-sided diverticulosis and a small adenomatous polyp was removed. History of chronic pain on chronic narcotic pain meds. Nurse chaperone during my physical exam.  Past Medical History:  Diagnosis Date   Allergy    Anxiety    Chronic fatigue    Chronic pain    Depression    Depression    Gallbladder calculus with obstruction 1987   Hyperlipidemia    Hypertension    Migraine    Tumor of jaw 1979    Past Surgical History:  Procedure Laterality Date   CHOLECYSTECTOMY     PAROTIDECTOMY     TONSILLECTOMY      Prior to Admission medications   Medication Sig Start Date End Date Taking? Authorizing Provider  aspirin 325 MG tablet Take 325 mg by mouth daily.    [provider]  Cholecalciferol (VITAMIN D) 2000 units CAPS Take 1 capsule by mouth once a week.    [provider]  FLUoxetine (PROZAC) 40 MG capsule Take 40 mg by mouth daily.    [provider]  losartan (COZAAR) 50 MG tablet Take 25 mg by mouth daily.    [provider]  oxyCODONE-acetaminophen (PERCOCET) 7.5-325 MG tablet Take by mouth. 10/28/21   [provider]   oxyCODONE-acetaminophen (PERCOCET/ROXICET) 5-325 MG tablet Take 1-2 tablets by mouth every 4 (four) hours as needed for severe pain.    [provider]    Scheduled Meds:  docusate sodium  200 mg Oral BID   enoxaparin (LOVENOX) injection  40 mg Subcutaneous Q24H   lactulose  20 g Oral TID   polyethylene glycol  17 g Oral BID   sodium chloride flush  3 mL Intravenous Q12H   Continuous Infusions:  piperacillin-tazobactam (ZOSYN)  IV 3.375 g (01/08/23 0355)   PRN Meds:.acetaminophen **OR** acetaminophen, HYDROmorphone (DILAUDID) injection, ondansetron (ZOFRAN) IV, oxyCODONE  Allergies as of 01/07/2023 - Review Complete 01/07/2023  Allergen Reaction Noted   Gabapentin  10/24/2017   Levaquin [levofloxacin] Other (See Comments) 10/24/2017   Metformin and related  10/24/2017   Tizanidine  10/24/2017   Venlafaxine  10/24/2017   Wellbutrin [bupropion]  10/24/2017   Biofreeze [menthol (topical analgesic)] Rash 10/24/2017    Family History  Problem Relation Age of Onset   Cancer Mother    Uterine cancer Mother    Immunodeficiency Sister     Social History   Socioeconomic History   Marital status: Married    Spouse name: Bruce   Number of children: 1   Years of education: Not on file   Highest education level: Not on file  Occupational History   Occupation: Production designer, theatre/television/film  Tobacco Use   Smoking status: Never   Smokeless  tobacco: Never  Substance and Sexual Activity   Alcohol use: No   Drug use: No   Sexual activity: Yes  Other Topics Concern   Not on file  Social History Narrative   Not on file   Social Determinants of Health   Financial Resource Strain: Not on file  Food Insecurity: No Food Insecurity (01/08/2023)   Hunger Vital Sign    Worried About Running Out of Food in the Last Year: Never true    Ran Out of Food in the Last Year: Never true  Transportation Needs: No Transportation Needs (01/08/2023)   PRAPARE - Administrator, Civil Service  (Medical): No    Lack of Transportation (Non-Medical): No  Physical Activity: Not on file  Stress: Not on file  Social Connections: Not on file  Intimate Partner Violence: Not At Risk (01/08/2023)   Humiliation, Afraid, Rape, and Kick questionnaire    Fear of Current or Ex-Partner: No    Emotionally Abused: No    Physically Abused: No    Sexually Abused: No    Review of Systems: All negative except as stated above in HPI.  Physical Exam: Vital signs: Vitals:   01/08/23 0040 01/08/23 0350  BP: 123/81 124/69  Pulse: 99 83  Resp: 14 18  Temp: 99.9 F (37.7 C) 98.9 F (37.2 C)  SpO2: 95% 94%   Last BM Date : 01/07/23 General:  Lethargic, Well-developed, well-nourished, no acute distress  Head: normocephalic, atraumatic Eyes: anicteric sclera ENT: oropharynx clear Neck: supple, nontender Lungs:  Clear throughout to auscultation.   No wheezes, crackles, or rhonchi. No acute distress. Heart:  Regular rate and rhythm; no murmurs, clicks, rubs,  or gallops. Abdomen: LLQ tenderness with guarding, mild RLQ tenderness with guarding, soft, nondistended, +BS  Rectal:  Deferred Ext: no edema  GI:  Lab Results: Recent Labs    01/07/23 1806 01/08/23 0328  WBC 24.0* 15.7*  HGB 14.2 11.3*  HCT 41.9 34.1*  PLT 627* 444*   BMET Recent Labs    01/07/23 2019 01/08/23 0328  NA 139 137  K 3.7 3.3*  CL 103 102  CO2 23 24  GLUCOSE 167* 149*  BUN 15 12  CREATININE 0.74 0.88  CALCIUM 8.4* 8.0*   LFT Recent Labs    01/08/23 0328  PROT 6.1*  ALBUMIN 3.0*  AST 28  ALT 24  ALKPHOS 71  BILITOT 1.3*   PT/INR No results for input(s): "LABPROT", "INR" in the last 72 hours.   Studies/Results: CT ABDOMEN PELVIS W CONTRAST  Result Date: 01/07/2023 CLINICAL DATA:  Acute abdominal pain. Nausea and vomiting. EXAM: CT ABDOMEN AND PELVIS WITH CONTRAST TECHNIQUE: Multidetector CT imaging of the abdomen and pelvis was performed using the standard protocol following bolus  administration of intravenous contrast. RADIATION DOSE REDUCTION: This exam was performed according to the departmental dose-optimization program which includes automated exposure control, adjustment of the mA and/or kV according to patient size and/or use of iterative reconstruction technique. CONTRAST:  80mL OMNIPAQUE IOHEXOL 300 MG/ML  SOLN COMPARISON:  None Available. FINDINGS: Lower chest: Subsegmental atelectasis in the right lower lobe. No pleural fluid. Hepatobiliary: Mild diffuse hepatic steatosis. No focal liver lesion. Cholecystectomy. There is intra and extrahepatic biliary ductal dilatation. The common bile duct measures 16 mm. Normal tapering to the duodenal insertion. Pancreas: No ductal dilatation or inflammation. Spleen: Normal in size without focal abnormality. Adrenals/Urinary Tract: 3.9 cm left adrenal nodule with Hounsfield units of 62. Normal right adrenal gland. No hydronephrosis  or perinephric edema. Homogeneous renal enhancement with symmetric excretion on delayed phase imaging. No focal renal abnormality or stone. Urinary bladder is completely empty and not well assessed. Stomach/Bowel: Minimal hiatal hernia. There is a small posterior gastric diverticulum. No small bowel obstruction or inflammation. The appendix is normal caliber, however there is flaring of the distal appendix with a 11 mm fluid density structure at the appendiceal tip, best appreciated on series 6, image 33. No peri appendiceal fat stranding. Large volume of stool in the colon with colonic redundancy. There is a transition from stool-filled to non stool-filled colon in the sigmoid, series 2, image 76. The more distal colon is decompressed. There is no obvious obstructing colonic mass. Multiple colonic diverticula but no focally inflamed diverticulum or evidence of diverticulitis. Vascular/Lymphatic: Aortic atherosclerosis without aneurysm. The portal vein is patent. No abdominopelvic adenopathy, including no pericolonic  adenopathy. Reproductive: Uterus and bilateral adnexa are unremarkable. Other: Trace scattered free fluid in the pelvis. Free air or focal fluid collection. There is a small fat containing umbilical hernia. Minimal fat in both inguinal canals. 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 Diffuse facet hypertrophy in the lumbar spine. There are no acute or suspicious osseous abnormalities. IMPRESSION: 1. Large volume of stool in the colon with colonic redundancy, suggesting constipation. There is a transition from stool-filled to non stool-filled colon in the sigmoid, without obvious obstructing colonic mass or inflammation. Recommend correlation with colonoscopy to exclude CT occult colonic mass. 2. Normal caliber appendix, however there is a 11 mm fluid density structure at the appendiceal tip, which may represent a mucocele. No peri appendiceal fat stranding. Recommend elective surgical consultation. 3. Intra and extrahepatic biliary ductal dilatation postcholecystectomy. Recommend correlation with LFTs. If elevated, recommend further evaluation with MRCP. 4. Colonic diverticulosis without diverticulitis. 5. Mild hepatic steatosis. 6. Indeterminate 3.9 cm left adrenal nodule. Recommend adrenal washout CT or chemical shift MRI. JACR 2017 Aug; 14(8):1038-44, JCAT 2016 Mar-Apr; 40(2):194-200, Urol J 2006 Spring; 3(2):71-4. Aortic Atherosclerosis (ICD10-I70.0). Electronically Signed   By: Narda Rutherford M.D.   On: 01/07/2023 18:49    Impression/Plan: Acute onset of abdominal pain, N/V with leucocytosis - question fecal impaction on CT. Doubt colonic mass with colonoscopy 4 years ago but will do 2 day prep and updated colonoscopy to further evaluate. WBC improving. Clear liquid diet. Miralax prep today and Trilyte prep tomorrow. Colonoscopy scheduled for 1130 AM on 01/10/23.    LOS: 0 days   [name]  01/08/2023, 11:28 AM  Questions please call [phone]

## 2023-01-08 NOTE — Evaluation (Signed)
Physical Therapy Evaluation Patient Details Name: Andrea Mcclure MRN: [id number] DOB: [birth year] Today's Date: 01/08/2023  History of Present Illness  62 y/o F admitted to [facility] on 6/7 for abdominal pain, nausea, and vomiting. CT of abdomen showing nonspecific colonic changes, large stool burden, and question for possible colonic mass. PMHx: HTN, depression, chrnoic pain  Clinical Impression  Pt presents today functioning at or close to her mobility baseline. Pt reports feeling improved today, noting ease with ambulation prior to day admitted, able to perform all mobility mod I to independent. Pt reports no concerns with mobility upon discharge, reports ambulating in the room and to the bathroom, encouraged pt to stay mobile during admission. Pt has no further benefit from skilled acute PT at this time, recommend return home upon discharge with no further PT needs. Acute PT will sign off.        Recommendations for follow up therapy are one component of a multi-disciplinary discharge planning process, led by the attending physician.  Recommendations may be updated based on patient status, additional functional criteria and insurance authorization.  Follow Up Recommendations       Assistance Recommended at Discharge PRN  Patient can return home with the following  Assist for transportation    Equipment Recommendations None recommended by PT  Recommendations for Other Services       Functional Status Assessment Patient has not had a recent decline in their functional status     Precautions / Restrictions Precautions Precautions: Fall Restrictions Weight Bearing Restrictions: No      Mobility  Bed Mobility Overal bed mobility: Modified Independent             General bed mobility comments: HOB elevated    Transfers Overall transfer level: Independent Equipment used: None                    Ambulation/Gait Ambulation/Gait assistance: Modified independent  (Device/Increase time) Gait Distance (Feet): 250 Feet Assistive device: None Gait Pattern/deviations: Step-through pattern, Decreased stride length, Wide base of support Gait velocity: mildly decreased     General Gait Details: wider BOS and slow gait speed but no overt LOB, no use of an AD, tolerating well  Stairs            Wheelchair Mobility    Modified Rankin (Stroke Patients Only)       Balance Overall balance assessment: No apparent balance deficits (not formally assessed)                                           Pertinent Vitals/Pain Pain Assessment Pain Assessment: 0-10 Pain Score: 6  Pain Location: back and stomach Pain Descriptors / Indicators: Discomfort, Grimacing Pain Intervention(s): Limited activity within patient's tolerance, Monitored during session, Repositioned, Patient requesting pain meds-RN notified    Home Living Family/patient expects to be discharged to:: Private residence Living Arrangements: Spouse/significant other Available Help at Discharge: Family;Available 24 hours/day Type of Home: House Home Access: Stairs to enter Entrance Stairs-Rails: None Entrance Stairs-Number of Steps: 1 threshold   Home Layout: One level Home Equipment: Shower seat;Hand held shower head Additional Comments: has a path around her house that is a quarter mile that she walks, walked half a mile the day before admission    Prior Function Prior Level of Function : Independent/Modified Independent;Driving  Mobility Comments: independent with all mobility, near fall when dog tripped her but able to catch herself ADLs Comments: independent     Hand Dominance   Dominant Hand: Right    Extremity/Trunk Assessment   Upper Extremity Assessment Upper Extremity Assessment: Defer to OT evaluation    Lower Extremity Assessment Lower Extremity Assessment: Overall WFL for tasks assessed    Cervical / Trunk  Assessment Cervical / Trunk Assessment: Normal  Communication   Communication: No difficulties  Cognition Arousal/Alertness: Awake/alert Behavior During Therapy: WFL for tasks assessed/performed Overall Cognitive Status: No family/caregiver present to determine baseline cognitive functioning                                 General Comments: A&Ox4, following commands, reports noticing changes to her cognition since medication changes in October, reports feeling easily distracted, noted to be distracted when alarms beeping        General Comments General comments (skin integrity, edema, etc.): VSS on room air    Exercises     Assessment/Plan    PT Assessment Patient does not need any further PT services  PT Problem List         PT Treatment Interventions      PT Goals (Current goals can be found in the Care Plan section)  Acute Rehab PT Goals Patient Stated Goal: improve abdominal pain and go home PT Goal Formulation: All assessment and education complete, DC therapy    Frequency       Co-evaluation               AM-PAC PT "6 Clicks" Mobility  Outcome Measure Help needed turning from your back to your side while in a flat bed without using bedrails?: None Help needed moving from lying on your back to sitting on the side of a flat bed without using bedrails?: None Help needed moving to and from a bed to a chair (including a wheelchair)?: None Help needed standing up from a chair using your arms (e.g., wheelchair or bedside chair)?: None Help needed to walk in hospital room?: None Help needed climbing 3-5 steps with a railing? : None 6 Click Score: 24    End of Session Equipment Utilized During Treatment: Gait belt Activity Tolerance: Patient tolerated treatment well Patient left: in bed;with call bell/phone within reach Nurse Communication: Mobility status PT Visit Diagnosis: Other abnormalities of gait and mobility (R26.89)    Time:  1610-9604 PT Time Calculation (min) (ACUTE ONLY): 14 min   Charges:   PT Evaluation $PT Eval Low Complexity: 1 Low          Lindalou Hose, PT DPT Acute Rehabilitation Services Office [phone]   Leonie Man 01/08/2023, 3:47 PM

## 2023-01-08 NOTE — H&P (Signed)
History and Physical    Andrea Mcclure ZOX:[id number] DOB: 24-Jan-1961 DOA: 01/07/2023  PCP: Melida Quitter, MD   Patient coming from: Home   Chief Complaint: Abdominal pain, N/V   HPI: Andrea Mcclure is a 62 y.o. female with medical history significant for hypertension, depression, and chronic pain who presents emergency department with abdominal pain, nausea, and vomiting.  Patient reports that she woke at roughly 4 AM with pain across her lower abdomen.  She went on to develop nausea, vomiting, sweats, and shaking chills.  Pain was becoming severe and she sought evaluation in the ED.  She reports having a normal bowel movement on 01/03/2023 but has only passed small amounts of stool since then.  Urine has been much darker than usual.  [facility] ED Course: Upon arrival to the ED, patient is found to be afebrile and saturating well on room air with elevated heart rate and stable blood pressure.  EKG demonstrates sinus tachycardia cardia.  Labs are most notable for WBC 24,000, platelets 627,000, and lactic acid 2.9.  CT of the abdomen and pelvis is notable for large volume of colonic stool and transition to no stool in the sigmoid without obstructing mass or inflammation, fluid density at the appendiceal tip without surrounding inflammation, intra and extrahepatic biliary dilatation, mild steatosis, and left adrenal nodule.  Patient was given 2 L of LR, 1 L of NS, 2 doses of Zofran, Zosyn, and was transferred to [facility] for admission.  Review of Systems:  All other systems reviewed and apart from HPI, are negative.  Past Medical History:  Diagnosis Date   Allergy    Anxiety    Chronic fatigue    Chronic pain    Depression    Depression    Gallbladder calculus with obstruction 1987   Hyperlipidemia    Hypertension    Migraine    Tumor of jaw 1979    Past Surgical History:  Procedure Laterality Date   CHOLECYSTECTOMY     PAROTIDECTOMY      TONSILLECTOMY      Social History:   reports that she has never smoked. She has never used smokeless tobacco. She reports that she does not drink alcohol and does not use drugs.  Allergies  Allergen Reactions   Gabapentin     unknown   Levaquin [Levofloxacin] Other (See Comments)   Metformin And Related     unknown   Tizanidine     unknown   Venlafaxine     unknown   Wellbutrin [Bupropion]     suicudal thoughts   Biofreeze [Menthol (Topical Analgesic)] Rash    Family History  Problem Relation Age of Onset   Cancer Mother    Uterine cancer Mother    Immunodeficiency Sister      Prior to Admission medications   Medication Sig Start Date End Date Taking? Authorizing Provider  aspirin 325 MG tablet Take 325 mg by mouth daily.    [provider]  Cholecalciferol (VITAMIN D) 2000 units CAPS Take 1 capsule by mouth once a week.    [provider]  FLUoxetine (PROZAC) 40 MG capsule Take 40 mg by mouth daily.    [provider]  losartan (COZAAR) 50 MG tablet Take 25 mg by mouth daily.    [provider]  oxyCODONE-acetaminophen (PERCOCET) 7.5-325 MG tablet Take by mouth. 10/28/21   [provider]  oxyCODONE-acetaminophen (PERCOCET/ROXICET) 5-325 MG tablet Take 1-2 tablets by mouth every 4 (four) hours  as needed for severe pain.    [provider]    Physical Exam: Vitals:   01/07/23 2000 01/07/23 2115 01/07/23 2300 01/08/23 0040  BP: (!) 143/78 (!) 142/68 130/67 123/81  Pulse: 98 97 94 99  Resp: 18 18 (!) 21 14  Temp:  98.7 F (37.1 C)  99.9 F (37.7 C)  TempSrc:    Oral  SpO2: 99% 95% 91% 95%  Weight:      Height:        Constitutional: NAD, no pallor or diaphoresis  Eyes: PERTLA, lids and conjunctivae normal ENMT: Mucous membranes are moist. Posterior pharynx clear of any exudate or lesions.   Neck: supple, no masses  Respiratory: no wheezing, no crackles. No accessory muscle use.  Cardiovascular: S1 & S2  heard, regular rate and rhythm. No extremity edema.  Abdomen: Soft, non-distended, tender in lower abdomen without rebound pain or guarding. Bowel sounds active.  Musculoskeletal: no clubbing / cyanosis. No joint deformity upper and lower extremities.   Skin: no significant rashes, lesions, ulcers. Warm, dry, well-perfused. Neurologic: CN 2-12 grossly intact. Moving all extremities. Alert and oriented.  Psychiatric: Pleasant. Cooperative.    Labs and Imaging on Admission: I have personally reviewed following labs and imaging studies  CBC: Recent Labs  Lab 01/07/23 1806  WBC 24.0*  NEUTROABS 22.4*  HGB 14.2  HCT 41.9  MCV 84.6  PLT 627*   Basic Metabolic Panel: Recent Labs  Lab 01/07/23 2019  NA 139  K 3.7  CL 103  CO2 23  GLUCOSE 167*  BUN 15  CREATININE 0.74  CALCIUM 8.4*   GFR: Estimated Creatinine Clearance: 88.6 mL/min (by C-G formula based on SCr of 0.74 mg/dL). Liver Function Tests: Recent Labs  Lab 01/07/23 2019  AST 27  ALT 22  ALKPHOS 84  BILITOT 1.2  PROT 7.0  ALBUMIN 3.9   Recent Labs  Lab 01/07/23 1801  LIPASE <10*   No results for input(s): "AMMONIA" in the last 168 hours. Coagulation Profile: No results for input(s): "INR", "PROTIME" in the last 168 hours. Cardiac Enzymes: No results for input(s): "CKTOTAL", "CKMB", "CKMBINDEX", "TROPONINI" in the last 168 hours. BNP (last 3 results) No results for input(s): "PROBNP" in the last 8760 hours. HbA1C: No results for input(s): "HGBA1C" in the last 72 hours. CBG: No results for input(s): "GLUCAP" in the last 168 hours. Lipid Profile: No results for input(s): "CHOL", "HDL", "LDLCALC", "TRIG", "CHOLHDL", "LDLDIRECT" in the last 72 hours. Thyroid Function Tests: No results for input(s): "TSH", "T4TOTAL", "FREET4", "T3FREE", "THYROIDAB" in the last 72 hours. Anemia Panel: No results for input(s): "VITAMINB12", "FOLATE", "FERRITIN", "TIBC", "IRON", "RETICCTPCT" in the last 72 hours. Urine  analysis:    Component Value Date/Time   COLORURINE YELLOW 01/07/2023 1800   APPEARANCEUR CLEAR 01/07/2023 1800   LABSPEC 1.040 (H) 01/07/2023 1800   PHURINE 6.0 01/07/2023 1800   GLUCOSEU NEGATIVE 01/07/2023 1800   HGBUR MODERATE (A) 01/07/2023 1800   BILIRUBINUR SMALL (A) 01/07/2023 1800   KETONESUR 15 (A) 01/07/2023 1800   PROTEINUR 30 (A) 01/07/2023 1800   NITRITE NEGATIVE 01/07/2023 1800   LEUKOCYTESUR NEGATIVE 01/07/2023 1800   Sepsis Labs: @LABRCNTIP (procalcitonin:4,lacticidven:4) )No results found for this or any previous visit (from the past 240 hour(s)).   Radiological Exams on Admission: CT ABDOMEN PELVIS W CONTRAST  Result Date: 01/07/2023 CLINICAL DATA:  Acute abdominal pain. Nausea and vomiting. EXAM: CT ABDOMEN AND PELVIS WITH CONTRAST TECHNIQUE: Multidetector CT imaging of the abdomen and pelvis was performed using  the standard protocol following bolus administration of intravenous contrast. RADIATION DOSE REDUCTION: This exam was performed according to the departmental dose-optimization program which includes automated exposure control, adjustment of the mA and/or kV according to patient size and/or use of iterative reconstruction technique. CONTRAST:  80mL OMNIPAQUE IOHEXOL 300 MG/ML  SOLN COMPARISON:  None Available. FINDINGS: Lower chest: Subsegmental atelectasis in the right lower lobe. No pleural fluid. Hepatobiliary: Mild diffuse hepatic steatosis. No focal liver lesion. Cholecystectomy. There is intra and extrahepatic biliary ductal dilatation. The common bile duct measures 16 mm. Normal tapering to the duodenal insertion. Pancreas: No ductal dilatation or inflammation. Spleen: Normal in size without focal abnormality. Adrenals/Urinary Tract: 3.9 cm left adrenal nodule with Hounsfield units of 62. Normal right adrenal gland. No hydronephrosis or perinephric edema. Homogeneous renal enhancement with symmetric excretion on delayed phase imaging. No focal renal abnormality  or stone. Urinary bladder is completely empty and not well assessed. Stomach/Bowel: Minimal hiatal hernia. There is a small posterior gastric diverticulum. No small bowel obstruction or inflammation. The appendix is normal caliber, however there is flaring of the distal appendix with a 11 mm fluid density structure at the appendiceal tip, best appreciated on series 6, image 33. No peri appendiceal fat stranding. Large volume of stool in the colon with colonic redundancy. There is a transition from stool-filled to non stool-filled colon in the sigmoid, series 2, image 76. The more distal colon is decompressed. There is no obvious obstructing colonic mass. Multiple colonic diverticula but no focally inflamed diverticulum or evidence of diverticulitis. Vascular/Lymphatic: Aortic atherosclerosis without aneurysm. The portal vein is patent. No abdominopelvic adenopathy, including no pericolonic adenopathy. Reproductive: Uterus and bilateral adnexa are unremarkable. Other: Trace scattered free fluid in the pelvis. Free air or focal fluid collection. There is a small fat containing umbilical hernia. Minimal fat in both inguinal canals. 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 Diffuse facet hypertrophy in the lumbar spine. There are no acute or suspicious osseous abnormalities. IMPRESSION: 1. Large volume of stool in the colon with colonic redundancy, suggesting constipation. There is a transition from stool-filled to non stool-filled colon in the sigmoid, without obvious obstructing colonic mass or inflammation. Recommend correlation with colonoscopy to exclude CT occult colonic mass. 2. Normal caliber appendix, however there is a 11 mm fluid density structure at the appendiceal tip, which may represent a mucocele. No peri appendiceal fat stranding. Recommend elective surgical consultation. 3. Intra and extrahepatic biliary ductal dilatation postcholecystectomy. Recommend correlation with LFTs. If elevated, recommend  further evaluation with MRCP. 4. Colonic diverticulosis without diverticulitis. 5. Mild hepatic steatosis. 6. Indeterminate 3.9 cm left adrenal nodule. Recommend adrenal washout CT or chemical shift MRI. JACR 2017 Aug; 14(8):1038-44, JCAT 2016 Mar-Apr; 40(2):194-200, Urol J 2006 Spring; 3(2):71-4. Aortic Atherosclerosis (ICD10-I70.0). Electronically Signed   By: Narda Rutherford M.D.   On: 01/07/2023 18:49    EKG: Independently reviewed. Sinus tachycardia, rate 110.   Assessment/Plan   1. Intractable abdominal pain; N/V  - Non-specific findings noted on CT abd/pelvis in ED; she continues to pass flatus and small amount of stool  - Continue pain-control and antiemetics, monitor volume status and electrolytes, allow clears for now and advance as she improves  2. SIRS  - Marked leukocytosis and tachycardia present in ED; she reports shaking chills at home but has been afebrile here  - She was given Zosyn in ED, will continue for now given her report of rigors, trend lactate, repeat CBC    3. Biliary ductal dilatation   -  Noted on CT in ED  - LFTs and lipase are normal and abdominal pain isolated to lower abdomen  - Repeat LFTs in am    4. Constipation - Last normal BM was 01/03/23; large volume of colonic stool noted on CT with transition to no stool in sigmoid  - Bowel regimen for now, correlation with colonoscopy recommended by radiology    5. Appendiceal lesion  - Non-specific fluid collection without inflammatory stranding noted at tip of appendix on CT in ED, possibly mucocele  - Elective surgical consultation recommended by radiology   6. Left adrenal nodule  - Discussed with patient, outpatient follow-up recommended    DVT prophylaxis: Lovenox  Code Status: Full  Level of Care: Level of care: Telemetry Medical Family Communication: None present  Disposition Plan:  Patient is from: Home  Anticipated d/c is to: Home  Anticipated d/c date is: 01/09/23  Patient currently: Pending  pain-control, tolerance of adequate oral intake  Consults called: None  Admission status: Observation     Briscoe Deutscher, MD [facility]  01/08/2023, 2:23 AM

## 2023-01-09 DIAGNOSIS — R109 Unspecified abdominal pain: Secondary | ICD-10-CM | POA: Diagnosis not present

## 2023-01-09 LAB — CBC WITH DIFFERENTIAL/PLATELET
Abs Immature Granulocytes: 0.02 10*3/uL (ref 0.00–0.07)
Basophils Absolute: 0 10*3/uL (ref 0.0–0.1)
Basophils Relative: 0 %
Eosinophils Absolute: 0.1 10*3/uL (ref 0.0–0.5)
Eosinophils Relative: 1 %
HCT: 31.1 % — ABNORMAL LOW (ref 36.0–46.0)
Hemoglobin: 10.3 g/dL — ABNORMAL LOW (ref 12.0–15.0)
Immature Granulocytes: 0 %
Lymphocytes Relative: 30 %
Lymphs Abs: 3.2 10*3/uL (ref 0.7–4.0)
MCH: 28 pg (ref 26.0–34.0)
MCHC: 33.1 g/dL (ref 30.0–36.0)
MCV: 84.5 fL (ref 80.0–100.0)
Monocytes Absolute: 0.8 10*3/uL (ref 0.1–1.0)
Monocytes Relative: 7 %
Neutro Abs: 6.7 10*3/uL (ref 1.7–7.7)
Neutrophils Relative %: 62 %
Platelets: 374 10*3/uL (ref 150–400)
RBC: 3.68 MIL/uL — ABNORMAL LOW (ref 3.87–5.11)
RDW: 13.6 % (ref 11.5–15.5)
WBC: 10.7 10*3/uL — ABNORMAL HIGH (ref 4.0–10.5)
nRBC: 0 % (ref 0.0–0.2)

## 2023-01-09 LAB — COMPREHENSIVE METABOLIC PANEL
ALT: 18 U/L (ref 0–44)
AST: 19 U/L (ref 15–41)
Albumin: 2.8 g/dL — ABNORMAL LOW (ref 3.5–5.0)
Alkaline Phosphatase: 63 U/L (ref 38–126)
Anion gap: 7 (ref 5–15)
BUN: 8 mg/dL (ref 8–23)
CO2: 24 mmol/L (ref 22–32)
Calcium: 8.1 mg/dL — ABNORMAL LOW (ref 8.9–10.3)
Chloride: 108 mmol/L (ref 98–111)
Creatinine, Ser: 0.76 mg/dL (ref 0.44–1.00)
GFR, Estimated: >60 mL/min (ref >60)
Glucose, Bld: 124 mg/dL — ABNORMAL HIGH (ref 70–99)
Potassium: 3.1 mmol/L — ABNORMAL LOW (ref 3.5–5.1)
Sodium: 139 mmol/L (ref 135–145)
Total Bilirubin: 0.9 mg/dL (ref 0.3–1.2)
Total Protein: 5.6 g/dL — ABNORMAL LOW (ref 6.5–8.1)

## 2023-01-09 LAB — PROCALCITONIN: Procalcitonin: 0.15 ng/mL

## 2023-01-09 LAB — C-REACTIVE PROTEIN: CRP: 1 mg/dL — ABNORMAL HIGH (ref <1.0)

## 2023-01-09 LAB — BRAIN NATRIURETIC PEPTIDE: B Natriuretic Peptide: 83.9 pg/mL (ref 0.0–100.0)

## 2023-01-09 LAB — MAGNESIUM: Magnesium: 2.1 mg/dL (ref 1.7–2.4)

## 2023-01-09 MED ORDER — OXYCODONE HCL 5 MG PO TABS
5.0000 mg | ORAL_TABLET | Freq: Three times a day (TID) | ORAL | Status: DC | PRN
  Administered 2023-01-10: 5 mg via ORAL
  Filled 2023-01-09: qty 1

## 2023-01-09 MED ORDER — LACTATED RINGERS IV SOLN: INTRAVENOUS | Status: DC

## 2023-01-09 MED ORDER — VITAMIN D 25 MCG (1000 UNIT) PO TABS
10000.0000 [IU] | ORAL_TABLET | Freq: Every day | ORAL | Status: DC
  Administered 2023-01-09 – 2023-01-10 (×2): 10000 [IU] via ORAL
  Filled 2023-01-09 (×4): qty 10

## 2023-01-09 MED ORDER — POLYETHYLENE GLYCOL 3350 17 GM/SCOOP PO POWD
1.0000 | Freq: Once | ORAL | Status: AC
  Administered 2023-01-09: 255 g via ORAL
  Filled 2023-01-09: qty 255

## 2023-01-09 MED ORDER — DULOXETINE HCL 20 MG PO CPEP
20.0000 mg | ORAL_CAPSULE | Freq: Two times a day (BID) | ORAL | Status: DC
  Administered 2023-01-09 – 2023-01-10 (×3): 20 mg via ORAL
  Filled 2023-01-09 (×5): qty 1

## 2023-01-09 MED ORDER — KETOROLAC TROMETHAMINE 30 MG/ML IJ SOLN
30.0000 mg | Freq: Once | INTRAMUSCULAR | Status: AC
  Administered 2023-01-09: 30 mg via INTRAVENOUS
  Filled 2023-01-09: qty 1

## 2023-01-09 MED ORDER — LACTULOSE 10 GM/15ML PO SOLN
20.0000 g | Freq: Three times a day (TID) | ORAL | Status: AC
  Administered 2023-01-09 (×3): 20 g via ORAL
  Filled 2023-01-09 (×3): qty 30

## 2023-01-09 MED ORDER — NALOXEGOL OXALATE 25 MG PO TABS
25.0000 mg | ORAL_TABLET | Freq: Every day | ORAL | Status: DC
  Administered 2023-01-09 – 2023-01-11 (×3): 25 mg via ORAL
  Filled 2023-01-09 (×3): qty 1

## 2023-01-09 MED ORDER — MORPHINE SULFATE (PF) 2 MG/ML IV SOLN
1.0000 mg | Freq: Four times a day (QID) | INTRAVENOUS | Status: DC | PRN
  Administered 2023-01-09 – 2023-01-11 (×5): 1 mg via INTRAVENOUS
  Filled 2023-01-09 (×5): qty 1

## 2023-01-09 MED ORDER — LORATADINE 10 MG PO TABS
10.0000 mg | ORAL_TABLET | Freq: Every day | ORAL | Status: DC
  Administered 2023-01-09 – 2023-01-10 (×2): 10 mg via ORAL
  Filled 2023-01-09 (×2): qty 1

## 2023-01-09 MED ORDER — POTASSIUM CHLORIDE CRYS ER 20 MEQ PO TBCR
40.0000 meq | EXTENDED_RELEASE_TABLET | Freq: Two times a day (BID) | ORAL | Status: AC
  Administered 2023-01-09 (×2): 40 meq via ORAL
  Filled 2023-01-09 (×2): qty 2

## 2023-01-09 NOTE — Progress Notes (Addendum)
PROGRESS NOTE                                                                                                                                                                                                             Patient Demographics:    Andrea Mcclure, is a 62 y.o. female, DOB - [birth year], ZOX:[id number]  Outpatient Primary MD for the patient is Nadene Rubins, Nyoka Cowden, MD    LOS - 1  Admit date - 01/07/2023    Chief Complaint  Patient presents with   Abdominal Pain       Brief Narrative (HPI from H&P)    62 y.o. female with medical history significant for hypertension, depression, and chronic pain who presents emergency department with abdominal pain, nausea, and vomiting.  Patient has history of chronic pain and is on narcotics and chronically constipated, workup in the hospital including CT abdomen pelvis suggestive of nonspecific colonic changes, large stool burden, question for possible colonic mass.   Subjective:   Patient in bed, appears comfortable, mild generalized headache, no chest pain or shortness of breath, left lower quadrant and generalized abdominal pain improved, no nausea, multiple BMs.   Assessment  & Plan :    Abdominal pain, nausea vomiting, questionable appendiceal lesion with question of occult colonic mass on CT scan. 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   Chronic pain.  Minimize narcotic use.  Nonspecific CBD changes on CT scan.  No right upper quadrant pain or discomfort, stable LFTs.  Monitor  Hypokalemia.  Replaced.    Left adrenal nodule.  Outpatient PCP directed workup.      Condition - Extremely Guarded  Family Communication  :  None  Code  Status :  Full  Consults  :  GI  PUD Prophylaxis :    Procedures  :     CT - 1. Large volume of stool in the colon with colonic redundancy, suggesting constipation. There is a transition from stool-filled to non stool-filled colon in the sigmoid, without obvious obstructing colonic mass or inflammation. Recommend correlation with colonoscopy to exclude CT occult colonic mass. 2.  Normal caliber appendix, however there is a 11 mm fluid density structure at the appendiceal tip, which may represent a mucocele. No peri appendiceal fat stranding. Recommend elective surgical consultation. 3. Intra and extrahepatic biliary ductal dilatation postcholecystectomy. Recommend correlation with LFTs. If elevated, recommend further evaluation with MRCP. 4. Colonic diverticulosis without diverticulitis. 5. Mild hepatic steatosis. 6. Indeterminate 3.9 cm left adrenal nodule. Recommend adrenal washout CT or chemical shift MRI.       Disposition Plan  :    Status is: Observation   DVT Prophylaxis  :    enoxaparin (LOVENOX) injection 40 mg Start: 01/08/23 1400    Lab Results  Component Value Date   PLT 374 01/09/2023    Diet :  Diet Order             Diet NPO time specified  Diet effective midnight           Diet clear liquid Room service appropriate? Yes; Fluid consistency: Thin  Diet effective now                    Inpatient Medications  Scheduled Meds:  cholecalciferol  10,000 Units Oral Daily   docusate sodium  200 mg Oral BID   DULoxetine  20 mg Oral BID   enoxaparin (LOVENOX) injection  40 mg Subcutaneous Q24H   ketorolac  30 mg Intravenous Once   lactulose  20 g Oral TID   lactulose  20 g Oral TID   loratadine  10 mg Oral Daily   naloxegol oxalate  25 mg Oral Daily   polyethylene glycol  17 g Oral BID   polyethylene glycol-electrolytes  4,000 mL Oral Once   potassium chloride  40 mEq Oral BID   sodium chloride flush  3 mL Intravenous Q12H   Continuous Infusions:  sodium  chloride 10 mL/hr at 01/08/23 1528   lactated ringers     piperacillin-tazobactam (ZOSYN)  IV 3.375 g (01/09/23 0449)   PRN Meds:.acetaminophen **OR** acetaminophen, HYDROmorphone (DILAUDID) injection, morphine injection, ondansetron (ZOFRAN) IV, oxyCODONE  Antibiotics  :    Anti-infectives (From admission, onward)    Start     Dose/Rate Route Frequency Ordered Stop   01/08/23 0400  piperacillin-tazobactam (ZOSYN) IVPB 3.375 g        3.375 g 12.5 mL/hr over 240 Minutes Intravenous Every 8 hours 01/08/23 0338     01/07/23 1900  piperacillin-tazobactam (ZOSYN) IVPB 3.375 g        3.375 g 12.5 mL/hr over 240 Minutes Intravenous  Once 01/07/23 1852 01/07/23 2007         Objective:   Vitals:   01/09/23 0031 01/09/23 0427 01/09/23 0429 01/09/23 0816  BP: 128/73 (!) 144/83  (!) 142/77  Pulse: 88 82  82  Resp: 18 13  17   Temp: 97.6 F (36.4 C) 97.9 F (36.6 C)  98.6 F (37 C)  TempSrc: Oral Oral  Oral  SpO2:      Weight:   90 kg   Height:        Wt Readings from Last 3 Encounters:  01/09/23 90 kg  11/05/21 90.7 kg  09/09/20 104.3 kg     Intake/Output Summary (Last 24 hours) at 01/09/2023 0907 Last data filed at 01/08/2023 1528 Gross per 24 hour  Intake 120.39 ml  Output --  Net 120.39 ml     Physical Exam  Awake Alert, No new F.N deficits, Normal affect [place].AT,PERRAL Supple Neck, No JVD,   Symmetrical Chest wall  movement, Good air movement bilaterally, CTAB RRR,No Gallops,Rubs or new Murmurs,  +ve B.Sounds, Abd Soft, mild LLQ tenderness,   No Cyanosis, Clubbing or edema        Data Review:    Recent Labs  Lab 01/07/23 1806 01/08/23 0328 01/09/23 0343  WBC 24.0* 15.7* 10.7*  HGB 14.2 11.3* 10.3*  HCT 41.9 34.1* 31.1*  PLT 627* 444* 374  MCV 84.6 84.2 84.5  MCH 28.7 27.9 28.0  MCHC 33.9 33.1 33.1  RDW 13.6 13.7 13.6  LYMPHSABS 1.1  --  3.2  MONOABS 0.4  --  0.8  EOSABS 0.0  --  0.1  BASOSABS 0.0  --  0.0    Recent Labs  Lab 01/07/23 1801  01/07/23 1935 01/07/23 2019 01/08/23 0328 01/09/23 0343  NA  --   --  139 137 139  K  --   --  3.7 3.3* 3.1*  CL  --   --  103 102 108  CO2  --   --  23 24 24   ANIONGAP  --   --  13 11 7   GLUCOSE  --   --  167* 149* 124*  BUN  --   --  15 12 8   CREATININE  --   --  0.74 0.88 0.76  AST  --   --  27 28 19   ALT  --   --  22 24 18   ALKPHOS  --   --  84 71 63  BILITOT  --   --  1.2 1.3* 0.9  ALBUMIN  --   --  3.9 3.0* 2.8*  CRP  --   --   --  1.7* 1.0*  PROCALCITON  --   --   --  0.32 0.15  LATICACIDVEN 2.9* 2.6*  --  2.0*  --   BNP  --   --   --   --  83.9  MG  --   --   --   --  2.1  CALCIUM  --   --  8.4* 8.0* 8.1*      Recent Labs  Lab 01/07/23 1801 01/07/23 1935 01/07/23 2019 01/08/23 0328 01/09/23 0343  CRP  --   --   --  1.7* 1.0*  PROCALCITON  --   --   --  0.32 0.15  LATICACIDVEN 2.9* 2.6*  --  2.0*  --   BNP  --   --   --   --  83.9  MG  --   --   --   --  2.1  CALCIUM  --   --  8.4* 8.0* 8.1*      Radiology Reports CT ABDOMEN PELVIS W CONTRAST  Result Date: 01/07/2023 CLINICAL DATA:  Acute abdominal pain. Nausea and vomiting. EXAM: CT ABDOMEN AND PELVIS WITH CONTRAST TECHNIQUE: Multidetector CT imaging of the abdomen and pelvis was performed using the standard protocol following bolus administration of intravenous contrast. RADIATION DOSE REDUCTION: This exam was performed according to the departmental dose-optimization program which includes automated exposure control, adjustment of the mA and/or kV according to patient size and/or use of iterative reconstruction technique. CONTRAST:  80mL OMNIPAQUE IOHEXOL 300 MG/ML  SOLN COMPARISON:  None Available. FINDINGS: Lower chest: Subsegmental atelectasis in the right lower lobe. No pleural fluid. Hepatobiliary: Mild diffuse hepatic steatosis. No focal liver lesion. Cholecystectomy. There is intra and extrahepatic biliary ductal dilatation. The common bile duct measures 16 mm. Normal tapering to the duodenal insertion.  Pancreas: No ductal  dilatation or inflammation. Spleen: Normal in size without focal abnormality. Adrenals/Urinary Tract: 3.9 cm left adrenal nodule with Hounsfield units of 62. Normal right adrenal gland. No hydronephrosis or perinephric edema. Homogeneous renal enhancement with symmetric excretion on delayed phase imaging. No focal renal abnormality or stone. Urinary bladder is completely empty and not well assessed. Stomach/Bowel: Minimal hiatal hernia. There is a small posterior gastric diverticulum. No small bowel obstruction or inflammation. The appendix is normal caliber, however there is flaring of the distal appendix with a 11 mm fluid density structure at the appendiceal tip, best appreciated on series 6, image 33. No peri appendiceal fat stranding. Large volume of stool in the colon with colonic redundancy. There is a transition from stool-filled to non stool-filled colon in the sigmoid, series 2, image 76. The more distal colon is decompressed. There is no obvious obstructing colonic mass. Multiple colonic diverticula but no focally inflamed diverticulum or evidence of diverticulitis. Vascular/Lymphatic: Aortic atherosclerosis without aneurysm. The portal vein is patent. No abdominopelvic adenopathy, including no pericolonic adenopathy. Reproductive: Uterus and bilateral adnexa are unremarkable. Other: Trace scattered free fluid in the pelvis. Free air or focal fluid collection. There is a small fat containing umbilical hernia. Minimal fat in both inguinal canals. 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 Diffuse facet hypertrophy in the lumbar spine. There are no acute or suspicious osseous abnormalities. IMPRESSION: 1. Large volume of stool in the colon with colonic redundancy, suggesting constipation. There is a transition from stool-filled to non stool-filled colon in the sigmoid, without obvious obstructing colonic mass or inflammation. Recommend correlation with colonoscopy to exclude CT occult  colonic mass. 2. Normal caliber appendix, however there is a 11 mm fluid density structure at the appendiceal tip, which may represent a mucocele. No peri appendiceal fat stranding. Recommend elective surgical consultation. 3. Intra and extrahepatic biliary ductal dilatation postcholecystectomy. Recommend correlation with LFTs. If elevated, recommend further evaluation with MRCP. 4. Colonic diverticulosis without diverticulitis. 5. Mild hepatic steatosis. 6. Indeterminate 3.9 cm left adrenal nodule. Recommend adrenal washout CT or chemical shift MRI. JACR 2017 Aug; 14(8):1038-44, JCAT 2016 Mar-Apr; 40(2):194-200, Urol J 2006 Spring; 3(2):71-4. Aortic Atherosclerosis (ICD10-I70.0). Electronically Signed   By: Narda Rutherford M.D.   On: 01/07/2023 18:49      Signature  -   Susa Raring M.D on 01/09/2023 at 9:07 AM   -  To page go to [email or web address]

## 2023-01-09 NOTE — Progress Notes (Signed)
Patient with migraine, nausea, and an episode of emesis overnight. In an emotional state at start of shift. Generally feeling terrible; stated "I might have a fever, I typically have them when I had migraines like this and feel like this." Utilized prn pain and nausea meds. Pain meds helping, nausea med did not help much. Not tolerating her miralax drink too well. Lactulose and other meds not tolerated well either. States she will continue to drink the miralax as she can. Has had multiple bowel movements overnight. She does state she feels she is generally feeling and getting better.

## 2023-01-09 NOTE — Anesthesia Preprocedure Evaluation (Addendum)
Anesthesia Evaluation  Patient identified by MRN, date of birth, ID band Patient awake    Reviewed: Allergy & Precautions, NPO status , Patient's Chart, lab work & pertinent test results  Airway Mallampati: III  TM Distance: >3 FB Neck ROM: Full    Dental  (+) Teeth Intact, Dental Advisory Given, Caps,    Pulmonary neg pulmonary ROS   Pulmonary exam normal breath sounds clear to auscultation       Cardiovascular hypertension, Pt. on medications Normal cardiovascular exam Rhythm:Regular Rate:Normal     Neuro/Psych  Headaches PSYCHIATRIC DISORDERS Anxiety Depression       GI/Hepatic Neg liver ROS,,,Abdominal pain; Abnormal CT scan   Endo/Other  Obesity   Renal/GU negative Renal ROS     Musculoskeletal  (+) Arthritis ,  Fibromyalgia -  Abdominal   Peds  Hematology negative hematology ROS (+)   Anesthesia Other Findings   Reproductive/Obstetrics                             Anesthesia Physical Anesthesia Plan  ASA: 2  Anesthesia Plan: MAC   Post-op Pain Management: Minimal or no pain anticipated   Induction: Intravenous  PONV Risk Score and Plan: 2 and TIVA and Treatment may vary due to age or medical condition  Airway Management Planned: Natural Airway and Simple Face Mask  Additional Equipment:   Intra-op Plan:   Post-operative Plan:   Informed Consent: I have reviewed the patients History and Physical, chart, labs and discussed the procedure including the risks, benefits and alternatives for the proposed anesthesia with the patient or authorized representative who has indicated his/her understanding and acceptance.     Dental advisory given  Plan Discussed with: CRNA  Anesthesia Plan Comments:        Anesthesia Quick Evaluation

## 2023-01-09 NOTE — Progress Notes (Signed)
[facility] Gastroenterology Progress Note  Andrea Mcclure 62 y.o. 07-15-1961   Subjective: Drank Miralax prep yesterday and having loose stools with small amount of form. Not able to drink NuLytely without gagging and vomiting and has only tolerated small amount. Abdominal pain has improved.  Objective: Vital signs: Vitals:   01/09/23 0816 01/09/23 1230  BP: (!) 142/77 (!) 153/100  Pulse: 82 93  Resp: 17 20  Temp: 98.6 F (37 C) 98.1 F (36.7 C)  SpO2:      Physical Exam: Gen: lethargic, no acute distress, well-nourished HEENT: anicteric sclera CV: RRR Chest: CTA B Abd: suprapubic tenderness with guarding, less tender in LLQ with guarding, soft, nondistended, +BS Ext: no edema  Lab Results: Recent Labs    01/08/23 0328 01/09/23 0343  NA 137 139  K 3.3* 3.1*  CL 102 108  CO2 24 24  GLUCOSE 149* 124*  BUN 12 8  CREATININE 0.88 0.76  CALCIUM 8.0* 8.1*  MG  --  2.1   Recent Labs    01/08/23 0328 01/09/23 0343  AST 28 19  ALT 24 18  ALKPHOS 71 63  BILITOT 1.3* 0.9  PROT 6.1* 5.6*  ALBUMIN 3.0* 2.8*   Recent Labs    01/07/23 1806 01/08/23 0328 01/09/23 0343  WBC 24.0* 15.7* 10.7*  NEUTROABS 22.4*  --  6.7  HGB 14.2 11.3* 10.3*  HCT 41.9 34.1* 31.1*  MCV 84.6 84.2 84.5  PLT 627* 444* 374      Assessment/Plan: Abdominal pain with sigmoid colon transition point on CT - undergoing day 2 prep today for colonoscopy scheduled for 1130 am tomorrow by Dr. Ewing Mcclure. Will change prep to another half gallon of Miralax since she is not tolerating the NuLytely. Clear liquid diet. NPO p MN.   [name] 01/09/2023, 2:07 PM  Questions please call [phone] ID: Andrea Mcclure, female   DOB: 1960/10/09, 62 y.o.   MRN: [id number]

## 2023-01-10 ENCOUNTER — Inpatient Hospital Stay (HOSPITAL_COMMUNITY): Payer: BLUE CROSS/BLUE SHIELD | Admitting: Anesthesiology

## 2023-01-10 ENCOUNTER — Encounter (HOSPITAL_COMMUNITY): Payer: Self-pay | Admitting: Internal Medicine

## 2023-01-10 ENCOUNTER — Encounter (HOSPITAL_COMMUNITY)
Admission: EM | Disposition: A | Discharge status: Home / Self Care | Payer: Self-pay | Source: Home / Self Care | Attending: Internal Medicine

## 2023-01-10 DIAGNOSIS — R109 Unspecified abdominal pain: Secondary | ICD-10-CM | POA: Diagnosis not present

## 2023-01-10 HISTORY — PX: COLONOSCOPY WITH PROPOFOL: SHX5780

## 2023-01-10 LAB — CBC WITH DIFFERENTIAL/PLATELET
Abs Immature Granulocytes: 0.03 10*3/uL (ref 0.00–0.07)
Basophils Absolute: 0 10*3/uL (ref 0.0–0.1)
Basophils Relative: 0 %
Eosinophils Absolute: 0.1 10*3/uL (ref 0.0–0.5)
Eosinophils Relative: 1 %
HCT: 30.4 % — ABNORMAL LOW (ref 36.0–46.0)
Hemoglobin: 10.1 g/dL — ABNORMAL LOW (ref 12.0–15.0)
Immature Granulocytes: 0 %
Lymphocytes Relative: 32 %
Lymphs Abs: 3.7 10*3/uL (ref 0.7–4.0)
MCH: 29 pg (ref 26.0–34.0)
MCHC: 33.2 g/dL (ref 30.0–36.0)
MCV: 87.4 fL (ref 80.0–100.0)
Monocytes Absolute: 1.1 10*3/uL — ABNORMAL HIGH (ref 0.1–1.0)
Monocytes Relative: 9 %
Neutro Abs: 6.6 10*3/uL (ref 1.7–7.7)
Neutrophils Relative %: 58 %
Platelets: 330 10*3/uL (ref 150–400)
RBC: 3.48 MIL/uL — ABNORMAL LOW (ref 3.87–5.11)
RDW: 13.2 % (ref 11.5–15.5)
WBC: 11.6 10*3/uL — ABNORMAL HIGH (ref 4.0–10.5)
nRBC: 0 % (ref 0.0–0.2)

## 2023-01-10 LAB — COMPREHENSIVE METABOLIC PANEL
ALT: 18 U/L (ref 0–44)
AST: 17 U/L (ref 15–41)
Albumin: 3 g/dL — ABNORMAL LOW (ref 3.5–5.0)
Alkaline Phosphatase: 58 U/L (ref 38–126)
Anion gap: 10 (ref 5–15)
BUN: 5 mg/dL — ABNORMAL LOW (ref 8–23)
CO2: 23 mmol/L (ref 22–32)
Calcium: 8.4 mg/dL — ABNORMAL LOW (ref 8.9–10.3)
Chloride: 103 mmol/L (ref 98–111)
Creatinine, Ser: 0.87 mg/dL (ref 0.44–1.00)
GFR, Estimated: >60 mL/min (ref >60)
Glucose, Bld: 118 mg/dL — ABNORMAL HIGH (ref 70–99)
Potassium: 2.8 mmol/L — ABNORMAL LOW (ref 3.5–5.1)
Sodium: 136 mmol/L (ref 135–145)
Total Bilirubin: 0.9 mg/dL (ref 0.3–1.2)
Total Protein: 6 g/dL — ABNORMAL LOW (ref 6.5–8.1)

## 2023-01-10 LAB — C-REACTIVE PROTEIN: CRP: <0.5 mg/dL (ref <1.0)

## 2023-01-10 LAB — BRAIN NATRIURETIC PEPTIDE: B Natriuretic Peptide: 33.8 pg/mL (ref 0.0–100.0)

## 2023-01-10 LAB — PROCALCITONIN: Procalcitonin: <0.1 ng/mL

## 2023-01-10 LAB — MAGNESIUM: Magnesium: 1.7 mg/dL (ref 1.7–2.4)

## 2023-01-10 SURGERY — COLONOSCOPY WITH PROPOFOL
Anesthesia: Monitor Anesthesia Care

## 2023-01-10 MED ORDER — PROPOFOL 10 MG/ML IV BOLUS
INTRAVENOUS | Status: DC | PRN
  Administered 2023-01-10 (×3): 20 mg via INTRAVENOUS

## 2023-01-10 MED ORDER — PROPOFOL 500 MG/50ML IV EMUL
INTRAVENOUS | Status: DC | PRN
  Administered 2023-01-10: 125 ug/kg/min via INTRAVENOUS

## 2023-01-10 MED ORDER — MAGNESIUM SULFATE IN D5W 1-5 GM/100ML-% IV SOLN
1.0000 g | Freq: Once | INTRAVENOUS | Status: AC
  Administered 2023-01-10: 1 g via INTRAVENOUS
  Filled 2023-01-10: qty 100

## 2023-01-10 MED ORDER — LIDOCAINE 2% (20 MG/ML) 5 ML SYRINGE
INTRAMUSCULAR | Status: DC | PRN
  Administered 2023-01-10: 20 mg via INTRAVENOUS

## 2023-01-10 MED ORDER — POTASSIUM CHLORIDE CRYS ER 20 MEQ PO TBCR: 40.0000 meq | EXTENDED_RELEASE_TABLET | Freq: Four times a day (QID) | ORAL | Status: DC

## 2023-01-10 MED ORDER — PHENYLEPHRINE HCL-NACL 20-0.9 MG/250ML-% IV SOLN: INTRAVENOUS | Status: DC | PRN

## 2023-01-10 MED ORDER — POTASSIUM CHLORIDE CRYS ER 20 MEQ PO TBCR
40.0000 meq | EXTENDED_RELEASE_TABLET | Freq: Once | ORAL | Status: AC
  Administered 2023-01-10: 40 meq via ORAL
  Filled 2023-01-10: qty 2

## 2023-01-10 MED ORDER — POTASSIUM CHLORIDE 10 MEQ/100ML IV SOLN
10.0000 meq | INTRAVENOUS | Status: AC
  Administered 2023-01-10 (×6): 10 meq via INTRAVENOUS
  Filled 2023-01-10 (×5): qty 100

## 2023-01-10 SURGICAL SUPPLY — 22 items

## 2023-01-10 NOTE — Anesthesia Postprocedure Evaluation (Signed)
Anesthesia Post Note  Patient: Andrea Mcclure  Procedure(s) Performed: COLONOSCOPY WITH PROPOFOL     Patient location during evaluation: Endoscopy Anesthesia Type: MAC Level of consciousness: oriented, awake and alert and awake Pain management: pain level controlled Vital Signs Assessment: post-procedure vital signs reviewed and stable Respiratory status: spontaneous breathing, nonlabored ventilation, respiratory function stable and patient connected to nasal cannula oxygen Cardiovascular status: blood pressure returned to baseline and stable Postop Assessment: no headache, no backache and no apparent nausea or vomiting Anesthetic complications: no   No notable events documented.  Last Vitals:  Vitals:   01/10/23 1001 01/10/23 1010  BP: 127/79 129/74  Pulse: 91 79  Resp: 14 16  Temp:    SpO2: 98% 96%    Last Pain:  Vitals:   01/10/23 1132  TempSrc:   PainSc: 2                  Collene Schlichter

## 2023-01-10 NOTE — Transfer of Care (Signed)
Immediate Anesthesia Transfer of Care Note  Patient: Andrea Mcclure  Procedure(s) Performed: COLONOSCOPY WITH PROPOFOL  Patient Location: PACU and Endoscopy Unit  Anesthesia Type:MAC  Level of Consciousness: sedated  Airway & Oxygen Therapy: Patient connected to nasal cannula oxygen  Post-op Assessment: Post -op Vital signs reviewed and stable  Post vital signs: stable  Last Vitals:  Vitals Value Taken Time  BP    Temp    Pulse    Resp    SpO2      Last Pain:  Vitals:   01/10/23 0900  TempSrc: Tympanic  PainSc: 0-No pain      Patients Stated Pain Goal: 0 (01/08/23 2008)  Complications: No notable events documented.

## 2023-01-10 NOTE — Plan of Care (Signed)
  Problem: Education: Goal: Knowledge of General Education information will improve Description: Including pain rating scale, medication(s)/side effects and non-pharmacologic comfort measures Outcome: Progressing   Problem: Health Behavior/Discharge Planning: Goal: Ability to manage health-related needs will improve Outcome: Progressing   Problem: Clinical Measurements: Goal: Respiratory complications will improve Outcome: Progressing   Problem: Elimination: Goal: Will not experience complications related to urinary retention Outcome: Progressing   Problem: Nutrition: Goal: Adequate nutrition will be maintained Outcome: Not Progressing   Problem: Coping: Goal: Level of anxiety will decrease Outcome: Not Progressing

## 2023-01-10 NOTE — Op Note (Signed)
[facility] Patient Name: Andrea Mcclure Procedure Date : 01/10/2023 MRN: [id number] Attending MD: Vida Rigger , MD, [id number] Date of Birth: 1960-12-18 CSN: [id number] Age: 62 Admit Type: Inpatient Procedure:                Colonoscopy Indications:              Follow-up for history of adenomatous polyps in the                            colon, Abnormal CT of the GI tract, Slow transit                            constipation Providers:                Vida Rigger, MD, Lorenza Evangelist, RN, Leanne Lovely, Technician, Ginette Otto, CRNA Referring MD:              Medicines:                Monitored Anesthesia Care Complications:            No immediate complications. Estimated Blood Loss:     Estimated blood loss: none. Procedure:                Pre-Anesthesia Assessment:                           - Prior to the procedure, a History and Physical                            was performed, and patient medications and                            allergies were reviewed. The patient's tolerance of                            previous anesthesia was also reviewed. The risks                            and benefits of the procedure and the sedation                            options and risks were discussed with the patient.                            All questions were answered, and informed consent                            was obtained. Prior Anticoagulants: The patient has                            taken Lovenox (enoxaparin), last dose was 1 day  prior to procedure. ASA Grade Assessment: II - A                            patient with mild systemic disease. After reviewing                            the risks and benefits, the patient was deemed in                            satisfactory condition to undergo the procedure.                           After obtaining informed consent, the colonoscope                             was passed under direct vision. Throughout the                            procedure, the patient's blood pressure, pulse, and                            oxygen saturations were monitored continuously. The                            [id number]-HQ190L ([id number]) Olympus colonoscope was                            introduced through the anus and advanced to the the                            cecum, identified by appendiceal orifice and                            ileocecal valve. The ileocecal valve, appendiceal                            orifice, and rectum were photographed. The                            colonoscopy was performed without difficulty. The                            patient tolerated the procedure well. The quality                            of the bowel preparation was unsatisfactory. Scope In: 9:36:30 AM Scope Out: 9:51:50 AM Scope Withdrawal Time: 0 hours 6 minutes 27 seconds  Total Procedure Duration: 0 hours 15 minutes 20 seconds  Findings:      External and internal hemorrhoids were found during retroflexion, during       perianal exam and during digital exam. The hemorrhoids were small.      A few small-mouthed diverticula were found in the sigmoid colon and       distal descending colon.  A moderate amount of semi-liquid stool was found in the entire colon,       making visualization difficult. Lavage of the area was performed using a       moderate amount of sterile water, resulting in incomplete clearance with       continued poor visualization. However visualization was good enough to       rule out any significant obstructing lesions particularly in the sigmoid      The exam was otherwise without abnormality on direct and retroflexion       views. Impression:               - Preparation of the colon was unsatisfactory.                           - External and internal hemorrhoids.                           - Diverticulosis in the sigmoid colon and in the                             distal descending colon.                           - Stool in the entire examined colon.                           - The examination was otherwise normal on direct                            and retroflexion views.                           - No specimens collected. Recommendation:           - Soft diet today. Hopefully can go home soon                           - Continue present medications. Add MiraLAX to her                            regimen and consider prescription laxatives                            particularly the antinarcotic constipation                            medicines needed                           - Repeat colonoscopy in 2 years for polyp screening                            purposes.                           - Return to GI office in Dr. Dulce Sellar PRN.  Particularly if outpatient over-the-counter                            laxatives are not working                           - Telephone GI clinic if symptomatic PRN. Please                            call me if I could be of any further assistance                            with this hospital stay Procedure Code(s):        --- Professional ---                           [id number], Colonoscopy, flexible; diagnostic, including                            collection of specimen(s) by brushing or washing,                            when performed (separate procedure) Diagnosis Code(s):        --- Professional ---                           Z86.010, Personal history of colonic polyps                           K59.01, Slow transit constipation                           K57.30, Diverticulosis of large intestine without                            perforation or abscess without bleeding                           R93.3, Abnormal findings on diagnostic imaging of                            other parts of digestive tract CPT copyright 2022 [employer]. All rights reserved. The  codes documented in this report are preliminary and upon coder review may  be revised to meet current compliance requirements. Vida Rigger, MD 01/10/2023 10:04:33 AM This report has been signed electronically. Number of Addenda: 0

## 2023-01-10 NOTE — Progress Notes (Signed)
PROGRESS NOTE                                                                                                                                                                                                             Patient Demographics:    Andrea Mcclure, is a 62 y.o. female, DOB - [birth year], WUJ:[id number]  Outpatient Primary MD for the patient is Nadene Rubins, Nyoka Cowden, MD    LOS - 2  Admit date - 01/07/2023    Chief Complaint  Patient presents with   Abdominal Pain       Brief Narrative (HPI from H&P)    62 y.o. female with medical history significant for hypertension, depression, and chronic pain who presents emergency department with abdominal pain, nausea, and vomiting.  Patient has history of chronic pain and is on narcotics and chronically constipated, workup in the hospital including CT abdomen pelvis suggestive of nonspecific colonic changes, large stool burden, question for possible colonic mass.   Subjective:   Patient in bed, appears comfortable, denies any headache, no fever, no chest pain or pressure, no shortness of breath , no abdominal pain. No focal weakness.   Assessment  & Plan :    Abdominal pain, nausea vomiting, questionable appendiceal lesion with question of occult colonic mass on CT scan. 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  Extensively counseled to minimize narcotic use.   Chronic pain.  Minimize narcotic use.  Nonspecific CBD changes on CT scan.  No right upper quadrant pain or discomfort, stable LFTs.  Monitor  Hypokalemia.   Replaced both oral and IV.    Left adrenal nodule.  Outpatient PCP directed workup.      Condition - Extremely Guarded  Family Communication  :  None  Code Status :  Full  Consults  :  GI  PUD Prophylaxis :    Procedures  :     Colonoscopy 01/09/2023 by [facility] Dr. Charyl Dancer.   -   Impression:               -  Preparation of the colon was unsatisfactory.                           - External and internal hemorrhoids.                           - Diverticulosis in the sigmoid colon and in the                            distal descending colon.                           - Stool in the entire examined colon.                           - The examination was otherwise normal on direct                            and retroflexion views.                           - No specimens collected. Recommendation:           - Soft diet today. Hopefully can go home soon                           - Continue present medications. Add MiraLAX to her                            regimen and consider prescription laxatives                            particularly the antinarcotic constipation                            medicines needed                           - Repeat colonoscopy in 2 years for polyp screening                            purposes.                           - Return to GI office in Dr. Dulce Sellar PRN.                            Particularly if outpatient over-the-counter                            laxatives are not working   CT - 1. Large volume of stool in the colon with colonic redundancy, suggesting constipation. There is a transition from stool-filled to non stool-filled colon in the sigmoid, without obvious obstructing colonic mass or inflammation. Recommend correlation with colonoscopy to exclude CT occult colonic mass. 2. Normal caliber appendix, however there is a 11 mm fluid density structure at the appendiceal tip, which  may represent a mucocele. No peri appendiceal fat stranding.  Recommend elective surgical consultation. 3. Intra and extrahepatic biliary ductal dilatation postcholecystectomy. Recommend correlation with LFTs. If elevated, recommend further evaluation with MRCP. 4. Colonic diverticulosis without diverticulitis. 5. Mild hepatic steatosis. 6. Indeterminate 3.9 cm left adrenal nodule. Recommend adrenal washout CT or chemical shift MRI.       Disposition Plan  :    Status is: Observation   DVT Prophylaxis  :    enoxaparin (LOVENOX) injection 40 mg Start: 01/08/23 1400    Lab Results  Component Value Date   PLT 330 01/10/2023    Diet :  Diet Order             Diet NPO time specified  Diet effective midnight                    Inpatient Medications  Scheduled Meds:  [MAR Hold] cholecalciferol  10,000 Units Oral Daily   [MAR Hold] docusate sodium  200 mg Oral BID   [MAR Hold] DULoxetine  20 mg Oral BID   [MAR Hold] enoxaparin (LOVENOX) injection  40 mg Subcutaneous Q24H   [MAR Hold] loratadine  10 mg Oral Daily   [MAR Hold] naloxegol oxalate  25 mg Oral Daily   [MAR Hold] polyethylene glycol  17 g Oral BID   [MAR Hold] sodium chloride flush  3 mL Intravenous Q12H   Continuous Infusions:  sodium chloride 10 mL/hr at 01/10/23 0600   lactated ringers 75 mL/hr at 01/10/23 0926   [MAR Hold] piperacillin-tazobactam (ZOSYN)  IV 12.5 mL/hr at 01/10/23 0600   [MAR Hold] potassium chloride 10 mEq (01/10/23 0808)   PRN Meds:.[MAR Hold] acetaminophen **OR** [MAR Hold] acetaminophen, [MAR Hold]  HYDROmorphone (DILAUDID) injection, [MAR Hold]  morphine injection, [MAR Hold] ondansetron (ZOFRAN) IV, [MAR Hold] oxyCODONE  Antibiotics  :    Anti-infectives (From admission, onward)    Start     Dose/Rate Route Frequency Ordered Stop   01/08/23 0400  [MAR Hold]  piperacillin-tazobactam (ZOSYN) IVPB 3.375 g        (MAR Hold since Mon 01/10/2023 at 0857.Hold Reason: Transfer to a Procedural area)   3.375 g 12.5 mL/hr over 240 Minutes Intravenous  Every 8 hours 01/08/23 0338     01/07/23 1900  piperacillin-tazobactam (ZOSYN) IVPB 3.375 g        3.375 g 12.5 mL/hr over 240 Minutes Intravenous  Once 01/07/23 1852 01/07/23 2007         Objective:   Vitals:   01/10/23 0958 01/10/23 1000 01/10/23 1001 01/10/23 1010  BP: 127/79 127/79 127/79 129/74  Pulse: 84 88 91 79  Resp: 19 19 14 16   Temp: (!) 97.4 F (36.3 C)     TempSrc: Tympanic     SpO2: 100% 94% 98% 96%  Weight:      Height:        Wt Readings from Last 3 Encounters:  01/10/23 91.8 kg  11/05/21 90.7 kg  09/09/20 104.3 kg     Intake/Output Summary (Last 24 hours) at 01/10/2023 1019 Last data filed at 01/10/2023 1017 Gross per 24 hour  Intake 2448.23 ml  Output --  Net 2448.23 ml     Physical Exam  Awake Alert, No new F.N deficits, Normal affect [place].AT,PERRAL Supple Neck, No JVD,   Symmetrical Chest wall movement, Good air movement bilaterally, CTAB RRR,No Gallops,Rubs or new Murmurs,  +ve B.Sounds, Abd Soft, no LLQ tenderness,   No Cyanosis, Clubbing or edema  Data Review:    Recent Labs  Lab 01/07/23 1806 01/08/23 0328 01/09/23 0343 01/10/23 0420  WBC 24.0* 15.7* 10.7* 11.6*  HGB 14.2 11.3* 10.3* 10.1*  HCT 41.9 34.1* 31.1* 30.4*  PLT 627* 444* 374 330  MCV 84.6 84.2 84.5 87.4  MCH 28.7 27.9 28.0 29.0  MCHC 33.9 33.1 33.1 33.2  RDW 13.6 13.7 13.6 13.2  LYMPHSABS 1.1  --  3.2 3.7  MONOABS 0.4  --  0.8 1.1*  EOSABS 0.0  --  0.1 0.1  BASOSABS 0.0  --  0.0 0.0    Recent Labs  Lab 01/07/23 1801 01/07/23 1935 01/07/23 2019 01/08/23 0328 01/09/23 0343 01/10/23 0420 01/10/23 0840  NA  --   --  139 137 139 136  --   K  --   --  3.7 3.3* 3.1* 2.8*  --   CL  --   --  103 102 108 103  --   CO2  --   --  23 24 24 23   --   ANIONGAP  --   --  13 11 7 10   --   GLUCOSE  --   --  167* 149* 124* 118*  --   BUN  --   --  15 12 8  5*  --   CREATININE  --   --  0.74 0.88 0.76 0.87  --   AST  --   --  27 28 19 17   --   ALT  --   --  22  24 18 18   --   ALKPHOS  --   --  84 71 63 58  --   BILITOT  --   --  1.2 1.3* 0.9 0.9  --   ALBUMIN  --   --  3.9 3.0* 2.8* 3.0*  --   CRP  --   --   --  1.7* 1.0* <0.5  --   PROCALCITON  --   --   --  0.32 0.15  --  <0.10  LATICACIDVEN 2.9* 2.6*  --  2.0*  --   --   --   BNP  --   --   --   --  83.9 33.8  --   MG  --   --   --   --  2.1 1.7  --   CALCIUM  --   --  8.4* 8.0* 8.1* 8.4*  --       Recent Labs  Lab 01/07/23 1801 01/07/23 1935 01/07/23 2019 01/08/23 0328 01/09/23 0343 01/10/23 0420 01/10/23 0840  CRP  --   --   --  1.7* 1.0* <0.5  --   PROCALCITON  --   --   --  0.32 0.15  --  <0.10  LATICACIDVEN 2.9* 2.6*  --  2.0*  --   --   --   BNP  --   --   --   --  83.9 33.8  --   MG  --   --   --   --  2.1 1.7  --   CALCIUM  --   --  8.4* 8.0* 8.1* 8.4*  --       Radiology Reports CT ABDOMEN PELVIS W CONTRAST  Result Date: 01/07/2023 CLINICAL DATA:  Acute abdominal pain. Nausea and vomiting. EXAM: CT ABDOMEN AND PELVIS WITH CONTRAST TECHNIQUE: Multidetector CT imaging of the abdomen and pelvis was performed using the standard protocol following bolus administration of intravenous contrast. RADIATION DOSE  REDUCTION: This exam was performed according to the departmental dose-optimization program which includes automated exposure control, adjustment of the mA and/or kV according to patient size and/or use of iterative reconstruction technique. CONTRAST:  80mL OMNIPAQUE IOHEXOL 300 MG/ML  SOLN COMPARISON:  None Available. FINDINGS: Lower chest: Subsegmental atelectasis in the right lower lobe. No pleural fluid. Hepatobiliary: Mild diffuse hepatic steatosis. No focal liver lesion. Cholecystectomy. There is intra and extrahepatic biliary ductal dilatation. The common bile duct measures 16 mm. Normal tapering to the duodenal insertion. Pancreas: No ductal dilatation or inflammation. Spleen: Normal in size without focal abnormality. Adrenals/Urinary Tract: 3.9 cm left adrenal nodule with  Hounsfield units of 62. Normal right adrenal gland. No hydronephrosis or perinephric edema. Homogeneous renal enhancement with symmetric excretion on delayed phase imaging. No focal renal abnormality or stone. Urinary bladder is completely empty and not well assessed. Stomach/Bowel: Minimal hiatal hernia. There is a small posterior gastric diverticulum. No small bowel obstruction or inflammation. The appendix is normal caliber, however there is flaring of the distal appendix with a 11 mm fluid density structure at the appendiceal tip, best appreciated on series 6, image 33. No peri appendiceal fat stranding. Large volume of stool in the colon with colonic redundancy. There is a transition from stool-filled to non stool-filled colon in the sigmoid, series 2, image 76. The more distal colon is decompressed. There is no obvious obstructing colonic mass. Multiple colonic diverticula but no focally inflamed diverticulum or evidence of diverticulitis. Vascular/Lymphatic: Aortic atherosclerosis without aneurysm. The portal vein is patent. No abdominopelvic adenopathy, including no pericolonic adenopathy. Reproductive: Uterus and bilateral adnexa are unremarkable. Other: Trace scattered free fluid in the pelvis. Free air or focal fluid collection. There is a small fat containing umbilical hernia. Minimal fat in both inguinal canals. 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 Diffuse facet hypertrophy in the lumbar spine. There are no acute or suspicious osseous abnormalities. IMPRESSION: 1. Large volume of stool in the colon with colonic redundancy, suggesting constipation. There is a transition from stool-filled to non stool-filled colon in the sigmoid, without obvious obstructing colonic mass or inflammation. Recommend correlation with colonoscopy to exclude CT occult colonic mass. 2. Normal caliber appendix, however there is a 11 mm fluid density structure at the appendiceal tip, which may represent a mucocele. No  peri appendiceal fat stranding. Recommend elective surgical consultation. 3. Intra and extrahepatic biliary ductal dilatation postcholecystectomy. Recommend correlation with LFTs. If elevated, recommend further evaluation with MRCP. 4. Colonic diverticulosis without diverticulitis. 5. Mild hepatic steatosis. 6. Indeterminate 3.9 cm left adrenal nodule. Recommend adrenal washout CT or chemical shift MRI. JACR 2017 Aug; 14(8):1038-44, JCAT 2016 Mar-Apr; 40(2):194-200, Urol J 2006 Spring; 3(2):71-4. Aortic Atherosclerosis (ICD10-I70.0). Electronically Signed   By: Narda Rutherford M.D.   On: 01/07/2023 18:49      Signature  -   Susa Raring M.D on 01/10/2023 at 10:19 AM   -  To page go to [email or web address]

## 2023-01-10 NOTE — Progress Notes (Signed)
Andrea Mcclure 9:16 AM  Subjective: Patient seen and examined in her hospital computer reviewed and her case discussed with my partner Dr. Bosie Clos she is not having pain moved her bowels 20 times we reviewed her previous colonoscopy she does have chronic constipation due to her narcotics for her back but usually Dulcolax works did not this week she has no new complaints  Objective: Vital signs stable afebrile no acute distress exam please see preassessment evaluation labs okay potassium little low CBC stable  Assessment: Resolved abdominal pain with laxatives abnormal CAT scan  Plan: Okay to proceed with flex sig or colonoscopy with anesthesia assistance and the procedure was discussed with the patient  [facility]  office [phone] After 5PM or if no answer call [phone]

## 2023-01-10 NOTE — Plan of Care (Signed)
  Problem: Education: Goal: Knowledge of General Education information will improve Description: Including pain rating scale, medication(s)/side effects and non-pharmacologic comfort measures Outcome: Progressing   Problem: Health Behavior/Discharge Planning: Goal: Ability to manage health-related needs will improve Outcome: Progressing   Problem: Clinical Measurements: Goal: Diagnostic test results will improve Outcome: Progressing Goal: Respiratory complications will improve Outcome: Progressing   Problem: Activity: Goal: Risk for activity intolerance will decrease Outcome: Progressing   Problem: Nutrition: Goal: Adequate nutrition will be maintained Outcome: Progressing   Problem: Elimination: Goal: Will not experience complications related to urinary retention Outcome: Progressing   Problem: Pain Managment: Goal: General experience of comfort will improve Outcome: Progressing   Problem: Safety: Goal: Ability to remain free from injury will improve Outcome: Progressing   Problem: Coping: Goal: Level of anxiety will decrease Outcome: Not Progressing

## 2023-01-11 ENCOUNTER — Other Ambulatory Visit (HOSPITAL_COMMUNITY): Payer: Self-pay

## 2023-01-11 DIAGNOSIS — R109 Unspecified abdominal pain: Secondary | ICD-10-CM | POA: Diagnosis not present

## 2023-01-11 LAB — BASIC METABOLIC PANEL
Anion gap: 9 (ref 5–15)
BUN: <5 mg/dL — ABNORMAL LOW (ref 8–23)
CO2: 26 mmol/L (ref 22–32)
Calcium: 8.5 mg/dL — ABNORMAL LOW (ref 8.9–10.3)
Chloride: 103 mmol/L (ref 98–111)
Creatinine, Ser: 0.84 mg/dL (ref 0.44–1.00)
GFR, Estimated: >60 mL/min (ref >60)
Glucose, Bld: 125 mg/dL — ABNORMAL HIGH (ref 70–99)
Potassium: 3.4 mmol/L — ABNORMAL LOW (ref 3.5–5.1)
Sodium: 138 mmol/L (ref 135–145)

## 2023-01-11 LAB — CBC WITH DIFFERENTIAL/PLATELET
Abs Immature Granulocytes: 0.04 10*3/uL (ref 0.00–0.07)
Basophils Absolute: 0.1 10*3/uL (ref 0.0–0.1)
Basophils Relative: 1 %
Eosinophils Absolute: 0.2 10*3/uL (ref 0.0–0.5)
Eosinophils Relative: 2 %
HCT: 31.4 % — ABNORMAL LOW (ref 36.0–46.0)
Hemoglobin: 10.4 g/dL — ABNORMAL LOW (ref 12.0–15.0)
Immature Granulocytes: 0 %
Lymphocytes Relative: 39 %
Lymphs Abs: 3.8 10*3/uL (ref 0.7–4.0)
MCH: 28.4 pg (ref 26.0–34.0)
MCHC: 33.1 g/dL (ref 30.0–36.0)
MCV: 85.8 fL (ref 80.0–100.0)
Monocytes Absolute: 0.9 10*3/uL (ref 0.1–1.0)
Monocytes Relative: 9 %
Neutro Abs: 4.8 10*3/uL (ref 1.7–7.7)
Neutrophils Relative %: 49 %
Platelets: 351 10*3/uL (ref 150–400)
RBC: 3.66 MIL/uL — ABNORMAL LOW (ref 3.87–5.11)
RDW: 13.2 % (ref 11.5–15.5)
WBC: 9.7 10*3/uL (ref 4.0–10.5)
nRBC: 0 % (ref 0.0–0.2)

## 2023-01-11 LAB — MAGNESIUM: Magnesium: 2 mg/dL (ref 1.7–2.4)

## 2023-01-11 MED ORDER — AMOXICILLIN-POT CLAVULANATE 875-125 MG PO TABS
1.0000 | ORAL_TABLET | Freq: Two times a day (BID) | ORAL | 0 refills | Status: AC
  Filled 2023-01-11: qty 6, 3d supply, fill #0

## 2023-01-11 MED ORDER — POLYETHYLENE GLYCOL 3350 17 GM/SCOOP PO POWD
17.0000 g | Freq: Every day | ORAL | 0 refills | Status: AC
  Filled 2023-01-11: qty 238, 14d supply, fill #0

## 2023-01-11 MED ORDER — BISACODYL 5 MG PO TBEC
5.0000 mg | DELAYED_RELEASE_TABLET | Freq: Every day | ORAL | 1 refills | Status: AC | PRN
  Filled 2023-01-11: qty 100, 100d supply, fill #0

## 2023-01-11 MED ORDER — POTASSIUM CHLORIDE CRYS ER 20 MEQ PO TBCR
40.0000 meq | EXTENDED_RELEASE_TABLET | Freq: Once | ORAL | Status: AC
  Administered 2023-01-11: 40 meq via ORAL
  Filled 2023-01-11: qty 2

## 2023-01-11 MED ORDER — DOCUSATE SODIUM 100 MG PO CAPS
200.0000 mg | ORAL_CAPSULE | Freq: Every day | ORAL | 0 refills | Status: AC | PRN
  Filled 2023-01-11: qty 100, 50d supply, fill #0

## 2023-01-11 MED ORDER — ACETAMINOPHEN 325 MG PO TABS
650.0000 mg | ORAL_TABLET | Freq: Four times a day (QID) | ORAL | 0 refills | Status: AC | PRN
  Filled 2023-01-11: qty 100, 13d supply, fill #0

## 2023-01-11 MED ORDER — OXYCODONE-ACETAMINOPHEN 5-325 MG PO TABS: 1.0000 | ORAL_TABLET | Freq: Three times a day (TID) | ORAL | Status: DC | PRN

## 2023-01-11 NOTE — Discharge Instructions (Addendum)
Follow with Primary MD Nadene Rubins Nyoka Cowden, MD in 7 days, review your CT scan findings with your primary care physician next visit, follow-up with the recommended gastroenterologist in the next 3 to 4 weeks.  Get CBC, CMP, Magnesium -  checked next visit with your primary MD    Activity: As tolerated with Full fall precautions use walker/cane & assistance as needed  Disposition Home   Diet: Heart Healthy    Special Instructions: If you have smoked or chewed Tobacco  in the last 2 yrs please stop smoking, stop any regular Alcohol  and or any Recreational drug use.  On your next visit with your primary care physician please Get Medicines reviewed and adjusted.  Please request your Prim.MD to go over all Hospital Tests and Procedure/Radiological results at the follow up, please get all Hospital records sent to your Prim MD by signing hospital release before you go home.  If you experience worsening of your admission symptoms, develop shortness of breath, life threatening emergency, suicidal or homicidal thoughts you must seek medical attention immediately by calling 911 or calling your MD immediately  if symptoms less severe.  You Must read complete instructions/literature along with all the possible adverse reactions/side effects for all the Medicines you take and that have been prescribed to you. Take any new Medicines after you have completely understood and accpet all the possible adverse reactions/side effects.

## 2023-01-11 NOTE — TOC CM/SW Note (Signed)
Transition of Care [facility]) - Inpatient Brief Assessment   Patient Details  Name: Shyvonne Chastang MRN: [id number] Date of Birth: June 09, 1961  Transition of Care [facility] [place] CM/SW Contact:    Gordy Clement, RN Phone Number: 01/11/2023, 9:17 AM   Clinical Narrative:  Patient from home with Husband.  Will dc to home today and follow up as instructed on AVS    No TOC needs identified    Transition of Care Asessment: Insurance and Status: Insurance coverage has been reviewed Patient has primary care physician: Yes Nadene Rubins, Nyoka Cowden, MD) Home environment has been reviewed: From Home with Husband Prior level of function:: Independent Prior/Current Home Services: No current home services Social Determinants of Health Reivew: SDOH reviewed no interventions necessary Readmission risk has been reviewed: Yes (16%) Transition of care needs: no transition of care needs at this time

## 2023-01-11 NOTE — Discharge Summary (Signed)
Andrea Mcclure ZHY:[id number] DOB: 1961/03/24 DOA: 01/07/2023  PCP: Melida Quitter, MD  Admit date: 01/07/2023  Discharge date: 01/11/2023  Admitted From: Home   Disposition:  Home   Recommendations for Outpatient Follow-up:   Follow up with PCP in 1-2 weeks  PCP Please obtain BMP/CBC, 2 view CXR in 1week,  (see Discharge instructions)   PCP Please follow up on the following pending results: Needs outpatient GI follow-up, gradually titrate off of chronic narcotics.  Has severe narcotic bowel.  Review CT findings, monitor left adrenal nodule.   Home Health: None   Equipment/Devices: None  Consultations: GI Discharge Condition: Stable     CODE STATUS: Full    Diet Recommendation: Heart Healthy     Chief Complaint  Patient presents with   Abdominal Pain     Brief history of present illness from the day of admission and additional interim summary    62 y.o. female with medical history significant for hypertension, depression, and chronic pain who presents emergency department with abdominal pain, nausea, and vomiting.  Patient has history of chronic pain and is on narcotics and chronically constipated, workup in the hospital including CT abdomen pelvis suggestive of nonspecific colonic changes, large stool burden, question for possible colonic mass.                                                                   Hospital Course   Abdominal pain, nausea vomiting, questionable appendiceal lesion with question of occult colonic mass on CT scan. most of her GI issues appear to be stemming from narcotic bowel and large stool burden, was placed on bowel regimen along with Zosyn, will transition to oral Augmentin for 3 more days, per GI she underwent extensive bowel prep with colonoscopy which unfortunately still  showed a lot of stool in her colon, continue bowel regimen, I have added Movantik for narcotic bowel since 01/09/2023, he has had over 20 bowel movements, upon discharge placed on a stable bowel regimen upon discharge and extensively counseled to minimize narcotic use which is the key in the long-term.  Follow-up with GI in the next few days as well as the prep was suboptimal.   Chronic pain.  Minimize narcotic use.   Nonspecific CBD changes on CT scan.  No right upper quadrant pain or discomfort, stable LFTs.  Monitor   Hypokalemia.  Replaced both oral and IV.     Left adrenal nodule.  Outpatient PCP directed workup.    Discharge diagnosis     Principal Problem:   Intractable abdominal pain Active Problems:   Chronic pain syndrome   Essential (primary) hypertension   Recurrent major depressive disorder (HCC)   SIRS (systemic inflammatory response syndrome) (HCC)   Left adrenal mass (HCC)   Mucocele of appendix  Constipation    Discharge instructions    Discharge Instructions     Diet - low sodium heart healthy   Complete by: As directed    Discharge instructions   Complete by: As directed    Follow with Primary MD Melida Quitter, MD in 7 days,  review your CT scan findings with your primary care physician next visit, follow-up with the recommended gastroenterologist in the next 3 to 4 weeks.  Get CBC, CMP, Magnesium -  checked next visit with your primary MD    Activity: As tolerated with Full fall precautions use walker/cane & assistance as needed  Disposition Home   Diet: Heart Healthy    Special Instructions: If you have smoked or chewed Tobacco  in the last 2 yrs please stop smoking, stop any regular Alcohol  and or any Recreational drug use.  On your next visit with your primary care physician please Get Medicines reviewed and adjusted.  Please request your Prim.MD to go over all Hospital Tests and Procedure/Radiological results at the follow up, please get all  Hospital records sent to your Prim MD by signing hospital release before you go home.  If you experience worsening of your admission symptoms, develop shortness of breath, life threatening emergency, suicidal or homicidal thoughts you must seek medical attention immediately by calling 911 or calling your MD immediately  if symptoms less severe.  You Must read complete instructions/literature along with all the possible adverse reactions/side effects for all the Medicines you take and that have been prescribed to you. Take any new Medicines after you have completely understood and accpet all the possible adverse reactions/side effects.   Increase activity slowly   Complete by: As directed        Discharge Medications   Allergies as of 01/11/2023       Reactions   Camphor Hives, Itching, Rash, Swelling   Tetanus Toxoids Other (See Comments)   Reaction: Fever (intolerance)   Gabapentin    unknown   Levaquin [levofloxacin] Other (See Comments)   Menthol Other (See Comments)   Metformin And Related    unknown   Tizanidine    unknown   Tizanidine Hcl Other (See Comments)   Venlafaxine    unknown   Wellbutrin [bupropion]    suicudal thoughts   Biofreeze [menthol (topical Analgesic)] Rash        Medication List     TAKE these medications    acetaminophen 325 MG tablet Commonly known as: TYLENOL Take 2 tablets (650 mg total) by mouth every 6 (six) hours as needed for mild pain or moderate pain.   amoxicillin-clavulanate 875-125 MG tablet Commonly known as: AUGMENTIN Take 1 tablet by mouth 2 (two) times daily.   aspirin 325 MG tablet Take 325 mg by mouth daily.   bisacodyl 5 MG EC tablet Commonly known as: Dulcolax Take 1 tablet (5 mg total) by mouth daily as needed for moderate constipation.   cetirizine 10 MG tablet Commonly known as: ZYRTEC Take 10 mg by mouth daily.   docusate sodium 100 MG capsule Commonly known as: COLACE Take 2 capsules (200 mg total) by  mouth daily as needed for mild constipation.   DULoxetine 20 MG capsule Commonly known as: CYMBALTA Take 20 mg by mouth 2 (two) times daily.   losartan 50 MG tablet Commonly known as: COZAAR Take 25 mg by mouth daily.   polyethylene glycol 17 g packet Commonly known as: MIRALAX / GLYCOLAX Take 17 g by mouth daily.  Vitamin D3 250 MCG (10000 UT) capsule Take 10,000 Units by mouth daily.   Xtampza ER 18 MG C12a Generic drug: oxyCODONE ER Take 1 capsule by mouth 2 (two) times daily.         Follow-up Information     Melida Quitter, MD. Schedule an appointment as soon as possible for a visit in 1 week(s).   Specialty: Internal Medicine Contact information: [address] [place] [address] [phone]         Vida Rigger, MD. Schedule an appointment as soon as possible for a visit in 1 week(s).   Specialty: Gastroenterology Contact information: [address] [place] [address] [phone]                 Major procedures and Radiology Reports - PLEASE review detailed and final reports thoroughly  -      CT ABDOMEN PELVIS W CONTRAST  Result Date: 01/07/2023 CLINICAL DATA:  Acute abdominal pain. Nausea and vomiting. EXAM: CT ABDOMEN AND PELVIS WITH CONTRAST TECHNIQUE: Multidetector CT imaging of the abdomen and pelvis was performed using the standard protocol following bolus administration of intravenous contrast. RADIATION DOSE REDUCTION: This exam was performed according to the departmental dose-optimization program which includes automated exposure control, adjustment of the mA and/or kV according to patient size and/or use of iterative reconstruction technique. CONTRAST:  80mL OMNIPAQUE IOHEXOL 300 MG/ML  SOLN COMPARISON:  None Available. FINDINGS: Lower chest: Subsegmental atelectasis in the right lower lobe. No pleural fluid. Hepatobiliary: Mild diffuse hepatic steatosis. No focal liver lesion. Cholecystectomy. There is intra and  extrahepatic biliary ductal dilatation. The common bile duct measures 16 mm. Normal tapering to the duodenal insertion. Pancreas: No ductal dilatation or inflammation. Spleen: Normal in size without focal abnormality. Adrenals/Urinary Tract: 3.9 cm left adrenal nodule with Hounsfield units of 62. Normal right adrenal gland. No hydronephrosis or perinephric edema. Homogeneous renal enhancement with symmetric excretion on delayed phase imaging. No focal renal abnormality or stone. Urinary bladder is completely empty and not well assessed. Stomach/Bowel: Minimal hiatal hernia. There is a small posterior gastric diverticulum. No small bowel obstruction or inflammation. The appendix is normal caliber, however there is flaring of the distal appendix with a 11 mm fluid density structure at the appendiceal tip, best appreciated on series 6, image 33. No peri appendiceal fat stranding. Large volume of stool in the colon with colonic redundancy. There is a transition from stool-filled to non stool-filled colon in the sigmoid, series 2, image 76. The more distal colon is decompressed. There is no obvious obstructing colonic mass. Multiple colonic diverticula but no focally inflamed diverticulum or evidence of diverticulitis. Vascular/Lymphatic: Aortic atherosclerosis without aneurysm. The portal vein is patent. No abdominopelvic adenopathy, including no pericolonic adenopathy. Reproductive: Uterus and bilateral adnexa are unremarkable. Other: Trace scattered free fluid in the pelvis. Free air or focal fluid collection. There is a small fat containing umbilical hernia. Minimal fat in both inguinal canals. 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 Diffuse facet hypertrophy in the lumbar spine. There are no acute or suspicious osseous abnormalities. IMPRESSION: 1. Large volume of stool in the colon with colonic redundancy, suggesting constipation. There is a transition from stool-filled to non stool-filled colon in the  sigmoid, without obvious obstructing colonic mass or inflammation. Recommend correlation with colonoscopy to exclude CT occult colonic mass. 2. Normal caliber appendix, however there is a 11 mm fluid density structure at the appendiceal tip, which may represent a mucocele. No peri appendiceal fat  stranding. Recommend elective surgical consultation. 3. Intra and extrahepatic biliary ductal dilatation postcholecystectomy. Recommend correlation with LFTs. If elevated, recommend further evaluation with MRCP. 4. Colonic diverticulosis without diverticulitis. 5. Mild hepatic steatosis. 6. Indeterminate 3.9 cm left adrenal nodule. Recommend adrenal washout CT or chemical shift MRI. JACR 2017 Aug; 14(8):1038-44, JCAT 2016 Mar-Apr; 40(2):194-200, Urol J 2006 Spring; 3(2):71-4. Aortic Atherosclerosis (ICD10-I70.0). Electronically Signed   By: Narda Rutherford M.D.   On: 01/07/2023 18:49    Micro Results    No results found for this or any previous visit (from the past 240 hour(s)).  Today   Subjective    Andrea Mcclure today has no headache,no chest abdominal pain,no new weakness tingling or numbness, feels much better wants to go home today.    Objective   Blood pressure (!) 146/85, pulse 84, temperature 98.2 F (36.8 C), resp. rate 18, height 5\' 7"  (1.702 m), weight 92 kg, last menstrual period 07/24/2012, SpO2 97 %.   Intake/Output Summary (Last 24 hours) at 01/11/2023 0756 Last data filed at 01/10/2023 1637 Gross per 24 hour  Intake 1278.45 ml  Output --  Net 1278.45 ml    Exam  Awake Alert, No new F.N deficits,    [place].AT,PERRAL Supple Neck,   Symmetrical Chest wall movement, Good air movement bilaterally, CTAB RRR,No Gallops,   +ve B.Sounds, Abd Soft, Non tender,  No Cyanosis, Clubbing or edema    Data Review   Recent Labs  Lab 01/07/23 1806 01/08/23 0328 01/09/23 0343 01/10/23 0420 01/11/23 0334  WBC 24.0* 15.7* 10.7* 11.6* 9.7  HGB 14.2 11.3* 10.3* 10.1* 10.4*  HCT 41.9  34.1* 31.1* 30.4* 31.4*  PLT 627* 444* 374 330 351  MCV 84.6 84.2 84.5 87.4 85.8  MCH 28.7 27.9 28.0 29.0 28.4  MCHC 33.9 33.1 33.1 33.2 33.1  RDW 13.6 13.7 13.6 13.2 13.2  LYMPHSABS 1.1  --  3.2 3.7 3.8  MONOABS 0.4  --  0.8 1.1* 0.9  EOSABS 0.0  --  0.1 0.1 0.2  BASOSABS 0.0  --  0.0 0.0 0.1    Recent Labs  Lab 01/07/23 1801 01/07/23 1935 01/07/23 2019 01/08/23 0328 01/09/23 0343 01/10/23 0420 01/10/23 0840 01/11/23 0334  NA  --   --  139 137 139 136  --  138  K  --   --  3.7 3.3* 3.1* 2.8*  --  3.4*  CL  --   --  103 102 108 103  --  103  CO2  --   --  23 24 24 23   --  26  ANIONGAP  --   --  13 11 7 10   --  9  GLUCOSE  --   --  167* 149* 124* 118*  --  125*  BUN  --   --  15 12 8  5*  --  <5*  CREATININE  --   --  0.74 0.88 0.76 0.87  --  0.84  AST  --   --  27 28 19 17   --   --   ALT  --   --  22 24 18 18   --   --   ALKPHOS  --   --  84 71 63 58  --   --   BILITOT  --   --  1.2 1.3* 0.9 0.9  --   --   ALBUMIN  --   --  3.9 3.0* 2.8* 3.0*  --   --   CRP  --   --   --  1.7* 1.0* <0.5  --   --   PROCALCITON  --   --   --  0.32 0.15  --  <0.10  --   LATICACIDVEN 2.9* 2.6*  --  2.0*  --   --   --   --   BNP  --   --   --   --  83.9 33.8  --   --   MG  --   --   --   --  2.1 1.7  --  2.0  CALCIUM  --   --  8.4* 8.0* 8.1* 8.4*  --  8.5*    Total Time in preparing paper work, data evaluation and todays exam - 35 minutes  Signature  -    Susa Raring M.D on 01/11/2023 at 7:56 AM   -  To page go to [email or web address]

## 2023-01-13 ENCOUNTER — Encounter (HOSPITAL_COMMUNITY): Payer: Self-pay | Admitting: Gastroenterology

## 2023-06-16 ENCOUNTER — Other Ambulatory Visit (HOSPITAL_COMMUNITY): Payer: Self-pay

## 2024-05-29 ENCOUNTER — Other Ambulatory Visit: Payer: Self-pay | Admitting: Internal Medicine

## 2024-05-29 DIAGNOSIS — E785 Hyperlipidemia, unspecified: Secondary | ICD-10-CM

## 2024-06-06 ENCOUNTER — Ambulatory Visit
Admission: RE | Admit: 2024-06-06 | Discharge: 2024-06-06 | Disposition: A | Discharge status: Home / Self Care | Source: Ambulatory Visit | Attending: Internal Medicine | Admitting: Internal Medicine

## 2024-06-06 DIAGNOSIS — E785 Hyperlipidemia, unspecified: Secondary | ICD-10-CM

## 2024-08-08 ENCOUNTER — Encounter (HOSPITAL_BASED_OUTPATIENT_CLINIC_OR_DEPARTMENT_OTHER): Payer: Self-pay

## 2024-08-09 ENCOUNTER — Encounter (HOSPITAL_BASED_OUTPATIENT_CLINIC_OR_DEPARTMENT_OTHER): Payer: Self-pay | Admitting: Internal Medicine

## 2024-08-09 ENCOUNTER — Ambulatory Visit (HOSPITAL_BASED_OUTPATIENT_CLINIC_OR_DEPARTMENT_OTHER): Admitting: Internal Medicine

## 2024-08-09 VITALS — BP 110/72 | HR 88 | Ht 67.0 in | Wt 223.1 lb

## 2024-08-09 DIAGNOSIS — I2510 Atherosclerotic heart disease of native coronary artery without angina pectoris: Secondary | ICD-10-CM | POA: Diagnosis not present

## 2024-08-09 DIAGNOSIS — E785 Hyperlipidemia, unspecified: Secondary | ICD-10-CM
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
## 2024-08-09 NOTE — Patient Instructions (Signed)
 Medication Instructions:  No changes *If you need a refill on your cardiac medications before your next appointment, please call your pharmacy*  Lab Work: Your physician recommends that you return for lab work within the next week   Lipid panel and Lp(a)  If you have labs (blood work) drawn today and your tests are completely normal, you will receive your results only by: MyChart Message (if you have MyChart) OR A paper copy in the mail If you have any lab test that is abnormal or we need to change your treatment, we will call you to review the results.  Testing/Procedures: none  Follow-Up: As needed

## 2024-08-16 LAB — LIPID PANEL
Chol/HDL Ratio: 2.7 ratio (ref 0.0–4.4)
Cholesterol, Total: 133 mg/dL (ref 100–199)
HDL: 49 mg/dL
LDL Chol Calc (NIH): 59 mg/dL (ref 0–99)
Triglycerides: 144 mg/dL (ref 0–149)
VLDL Cholesterol Cal: 25 mg/dL (ref 5–40)

## 2024-08-16 LAB — LIPOPROTEIN A (LPA): Lipoprotein (a): 218.1 nmol/L — ABNORMAL HIGH

## 2024-08-20 ENCOUNTER — Ambulatory Visit: Payer: Self-pay | Admitting: Internal Medicine

## 2024-08-21 ENCOUNTER — Encounter: Payer: Self-pay | Admitting: *Deleted

## 2024-08-21 DIAGNOSIS — Z006 Encounter for examination for normal comparison and control in clinical research program: Secondary | ICD-10-CM

## 2024-08-21 NOTE — Research (Signed)
 Message left for Andrea Mcclure about pre-event research study. Encouraged her to call me back for more information.
